# Patient Record
Sex: Male | Born: 1970 | Race: White | Hispanic: No | Marital: Married | Smoking: Former smoker
Health system: Southern US, Community
[De-identification: ages and names within clinical notes are randomized; demographics above are authoritative.]

## PROBLEM LIST (undated history)

## (undated) DIAGNOSIS — F32A Depression, unspecified: Secondary | ICD-10-CM

## (undated) DIAGNOSIS — K254 Chronic or unspecified gastric ulcer with hemorrhage: Secondary | ICD-10-CM

## (undated) DIAGNOSIS — F419 Anxiety disorder, unspecified: Secondary | ICD-10-CM

## (undated) DIAGNOSIS — I10 Essential (primary) hypertension: Secondary | ICD-10-CM

## (undated) DIAGNOSIS — Z87442 Personal history of urinary calculi: Secondary | ICD-10-CM

## (undated) HISTORY — PX: ESOPHAGOGASTRODUODENOSCOPY: SHX1529

---

## 2007-01-28 ENCOUNTER — Emergency Department: Payer: Self-pay | Admitting: Emergency Medicine

## 2007-01-29 ENCOUNTER — Emergency Department: Payer: Self-pay | Admitting: Emergency Medicine

## 2007-04-04 ENCOUNTER — Other Ambulatory Visit: Payer: Self-pay

## 2007-04-04 ENCOUNTER — Emergency Department: Payer: Self-pay | Admitting: Emergency Medicine

## 2009-03-12 ENCOUNTER — Emergency Department: Payer: Self-pay | Admitting: Internal Medicine

## 2009-03-16 ENCOUNTER — Ambulatory Visit: Payer: Self-pay | Admitting: Gastroenterology

## 2011-01-21 ENCOUNTER — Emergency Department: Payer: Self-pay | Admitting: Emergency Medicine

## 2011-02-07 ENCOUNTER — Observation Stay: Payer: Self-pay | Admitting: Internal Medicine

## 2011-06-19 ENCOUNTER — Emergency Department (HOSPITAL_COMMUNITY)
Admission: EM | Admit: 2011-06-19 | Discharge: 2011-06-19 | Disposition: A | Discharge status: Home / Self Care | Payer: Self-pay | Attending: Emergency Medicine | Admitting: Emergency Medicine

## 2011-06-19 ENCOUNTER — Encounter (HOSPITAL_COMMUNITY): Payer: Self-pay | Admitting: *Deleted

## 2011-06-19 DIAGNOSIS — M549 Dorsalgia, unspecified: Secondary | ICD-10-CM | POA: Insufficient documentation

## 2011-06-19 DIAGNOSIS — R5381 Other malaise: Secondary | ICD-10-CM | POA: Insufficient documentation

## 2011-06-19 DIAGNOSIS — R51 Headache: Secondary | ICD-10-CM | POA: Insufficient documentation

## 2011-06-19 DIAGNOSIS — I10 Essential (primary) hypertension: Secondary | ICD-10-CM | POA: Insufficient documentation

## 2011-06-19 DIAGNOSIS — M542 Cervicalgia: Secondary | ICD-10-CM | POA: Insufficient documentation

## 2011-06-19 HISTORY — DX: Essential (primary) hypertension: I10

## 2011-06-19 HISTORY — DX: Chronic or unspecified gastric ulcer with hemorrhage: K25.4

## 2011-06-19 MED ORDER — CYCLOBENZAPRINE HCL 10 MG PO TABS: 10.0000 mg | ORAL_TABLET | Freq: Two times a day (BID) | ORAL | Status: AC | PRN

## 2011-06-19 MED ORDER — HYDROCODONE-ACETAMINOPHEN 5-325 MG PO TABS: 1.0000 | ORAL_TABLET | ORAL | Status: AC | PRN

## 2011-06-19 NOTE — ED Notes (Signed)
Reports having lower back pain, neck pain, pain radiates into the back of his head. Denies any n/v/d. Reports having fever/chills.

## 2011-06-19 NOTE — ED Notes (Signed)
Pt walked back to bathroom without difficulty.

## 2011-06-19 NOTE — Discharge Instructions (Signed)
FOLLOW UP WITH DR. Ranell Patrick FOR FURTHER EVALUATION AND MANAGEMENT OF RECURRENT NECK AND BACK PAIN. TAKE MEDICATIONS AS PRESCRIBED. RETURN HERE WITH ANY WORSENING SYMPTOMS OR NEW CONCERNS.  Back Pain, Adult Low back pain is very common. About 1 in 5 people have back pain.The cause of low back pain is rarely dangerous. The pain often gets better over time.About half of people with a sudden onset of back pain feel better in just 2 weeks. About 8 in 10 people feel better by 6 weeks.  CAUSES Some common causes of back pain include:  Strain of the muscles or ligaments supporting the spine.   Wear and tear (degeneration) of the spinal discs.   Arthritis.   Direct injury to the back.  DIAGNOSIS Most of the time, the direct cause of low back pain is not known.However, back pain can be treated effectively even when the exact cause of the pain is unknown.Answering your caregiver's questions about your overall health and symptoms is one of the most accurate ways to make sure the cause of your pain is not dangerous. If your caregiver needs more information, he or she may order lab work or imaging tests (X-rays or MRIs).However, even if imaging tests show changes in your back, this usually does not require surgery. HOME CARE INSTRUCTIONS For many people, back pain returns.Since low back pain is rarely dangerous, it is often a condition that people can learn to [facility] their own.   Remain active. It is stressful on the back to sit or stand in one place. Do not sit, drive, or stand in one place for more than 30 minutes at a time. Take short walks on level surfaces as soon as pain allows.Try to increase the length of time you walk each day.   Do not stay in bed.Resting more than 1 or 2 days can delay your recovery.   Do not avoid exercise or work.Your body is made to move.It is not dangerous to be active, even though your back may hurt.Your back will likely heal faster if you return to being active  before your pain is gone.   Pay attention to your body when you bend and lift. Many people have less discomfortwhen lifting if they bend their knees, keep the load close to their bodies,and avoid twisting. Often, the most comfortable positions are those that put less stress on your recovering back.   Find a comfortable position to sleep. Use a firm mattress and lie on your side with your knees slightly bent. If you lie on your back, put a pillow under your knees.   Only take over-the-counter or prescription medicines as directed by your caregiver. Over-the-counter medicines to reduce pain and inflammation are often the most helpful.Your caregiver may prescribe muscle relaxant drugs.These medicines help dull your pain so you can more quickly return to your normal activities and healthy exercise.   Put ice on the injured area.   Put ice in a plastic bag.   Place a towel between your skin and the bag.   Leave the ice on for 15 to 20 minutes, 3 to 4 times a day for the first 2 to 3 days. After that, ice and heat may be alternated to reduce pain and spasms.   Ask your caregiver about trying back exercises and gentle massage. This may be of some benefit.   Avoid feeling anxious or stressed.Stress increases muscle tension and can worsen back pain.It is important to recognize when you are anxious or stressed and  learn ways to manage it.Exercise is a great option.  SEEK MEDICAL CARE IF:  You have pain that is not relieved with rest or medicine.   You have pain that does not improve in 1 week.   You have new symptoms.   You are generally not feeling well.  SEEK IMMEDIATE MEDICAL CARE IF:   You have pain that radiates from your back into your legs.   You develop new bowel or bladder control problems.   You have unusual weakness or numbness in your arms or legs.   You develop nausea or vomiting.   You develop abdominal pain.   You feel faint.  Document Released: 01/28/2005  Document Revised: 01/17/2011 Document Reviewed: 06/18/2010 [facility] Patient Information 2012 [place], [place].

## 2011-06-19 NOTE — ED Notes (Signed)
Pt complaining of lower back, neck and head pain.  Pt states that his neck bothers him all the time and has been "for quite a while."  Pt states that his head hurts on the left side with palpation but denies hitting his head on anything.  Pt also complains of lower back pain; states that when he moves, the pain "feels different" but does not elaborate on difference.  Pt has range of motion in neck.  Pt states that he twists and turns a lot with daily job but denies anything out of ordinary relating to injuring himself.  Pt not in acute distress.

## 2011-06-19 NOTE — ED Provider Notes (Signed)
History     CSN: [id number]  Arrival date & time 06/19/11  1151   First MD Initiated Contact with Patient 06/19/11 1222      Chief Complaint  Patient presents with  . Back Pain  . Headache    (Consider location/radiation/quality/duration/timing/severity/associated sxs/prior treatment) Patient is a 41 y.o. male presenting with back pain. The history is provided by the patient.  Back Pain  This is a recurrent problem. The current episode started 6 to 12 hours ago. The problem occurs constantly. The problem has not changed since onset.Associated with: The patient works as an Journalist, newspaper. Associated symptoms include headaches and weakness. Pertinent negatives include no fever and no numbness. Associated symptoms comments: Pain in the neck and upper left neck that is recurrent. He feels that it is worse with movement and better with some positions. No numbness into the extremities but he feels that over time his grip bilaterally has become weak. He also complains of low back pain as well that recurs concomitantly with the neck pain..    Past Medical History  Diagnosis Date  . Hypertension   . Bleeding gastric ulcer     History reviewed. No pertinent past surgical history.  History reviewed. No pertinent family history.  History  Substance Use Topics  . Smoking status: Current Everyday Smoker    Types: Cigarettes  . Smokeless tobacco: Not on file  . Alcohol Use: No      Review of Systems  Constitutional: Negative for fever and chills.  HENT: Positive for neck pain.   Musculoskeletal: Positive for back pain.       See HPI  Skin: Negative.   Neurological: Positive for weakness and headaches. Negative for numbness.       Headache relates to upper neck pain.    Allergies  Morphine and related and Aspirin  Home Medications   Current Outpatient Rx  Name Route Sig Dispense Refill  . ALPRAZOLAM 0.25 MG PO TABS Oral Take 0.25 mg by mouth 3 (three) times daily. For anxiety     . LISINOPRIL 20 MG PO TABS Oral Take 20 mg by mouth daily.    . TRAMADOL HCL 50 MG PO TABS Oral Take 50 mg by mouth every 6 (six) hours as needed. For pain      BP 137/90  Pulse 98  Temp(Src) 97.8 F (36.6 C) (Oral)  Resp 18  SpO2 98%  Physical Exam  Constitutional: He is oriented to person, place, and time. He appears well-developed and well-nourished.  HENT:  Head: Normocephalic and atraumatic.  Neck: Normal range of motion.  Cardiovascular:  No murmur heard. Pulmonary/Chest: Effort normal. He has no wheezes. He has no rales.  Musculoskeletal:       Minimal left paracervical tenderness without swelling. FROM neck and upper extremities without difficulty. Grip strength equal. Distal pulses are 2+  Neurological: He is alert and oriented to person, place, and time. He has normal strength. No sensory deficit. He displays a negative Romberg sign.    ED Course  Procedures (including critical care time)  Labs Reviewed - No data to display No results found.   No diagnosis found. 1. Neck pain, recurrent 2. Back pain, recurrent   MDM  Headache is related to neck pain, which is recurrent without neurologic deficit today on exam. Will refer to ortho for further management of recurrent episodes.        Rodena Medin, PA-C 06/19/11 1305

## 2011-06-19 NOTE — ED Notes (Signed)
Pt to bathroom; walks to bathroom without limp or other trouble noted.

## 2011-06-24 NOTE — ED Provider Notes (Signed)
Medical screening examination/treatment/procedure(s) were performed by non-physician practitioner and as supervising physician I was immediately available for consultation/collaboration.  Lucian Baswell, MD 06/24/11 1011 

## 2012-04-20 ENCOUNTER — Ambulatory Visit: Payer: Self-pay | Admitting: Physical Medicine and Rehabilitation

## 2013-04-29 ENCOUNTER — Emergency Department: Payer: Self-pay | Admitting: Emergency Medicine

## 2013-04-29 LAB — COMPREHENSIVE METABOLIC PANEL
ALT: 23 U/L (ref 12–78)
ANION GAP: 5 — AB (ref 7–16)
Albumin: 3.3 g/dL — ABNORMAL LOW (ref 3.4–5.0)
Alkaline Phosphatase: 100 U/L
BUN: 8 mg/dL (ref 7–18)
Bilirubin,Total: 0.7 mg/dL (ref 0.2–1.0)
Calcium, Total: 7.8 mg/dL — ABNORMAL LOW (ref 8.5–10.1)
Chloride: 108 mmol/L — ABNORMAL HIGH (ref 98–107)
Co2: 28 mmol/L (ref 21–32)
Creatinine: 1.15 mg/dL (ref 0.60–1.30)
EGFR (African American): >60
EGFR (Non-African Amer.): >60
Glucose: 97 mg/dL (ref 65–99)
Osmolality: 280 (ref 275–301)
POTASSIUM: 3.7 mmol/L (ref 3.5–5.1)
SGOT(AST): 20 U/L (ref 15–37)
Sodium: 141 mmol/L (ref 136–145)
TOTAL PROTEIN: 5.8 g/dL — AB (ref 6.4–8.2)

## 2013-04-29 LAB — CBC
HCT: 41.4 % (ref 40.0–52.0)
HGB: 14.2 g/dL (ref 13.0–18.0)
MCH: 31.9 pg (ref 26.0–34.0)
MCHC: 34.4 g/dL (ref 32.0–36.0)
MCV: 93 fL (ref 80–100)
PLATELETS: 169 10*3/uL (ref 150–440)
RBC: 4.46 10*6/uL (ref 4.40–5.90)
RDW: 13.5 % (ref 11.5–14.5)
WBC: 8.9 10*3/uL (ref 3.8–10.6)

## 2013-04-29 LAB — PRO B NATRIURETIC PEPTIDE: B-TYPE NATIURETIC PEPTID: 82 pg/mL (ref 0–125)

## 2013-04-29 LAB — TROPONIN I

## 2013-08-12 ENCOUNTER — Emergency Department: Payer: Self-pay

## 2014-06-05 NOTE — Discharge Summary (Signed)
PATIENT NAME:  Daniel Harris, Daniel Harris MR#:  [id number] DATE OF BIRTH:  [birth year]  DATE OF ADMISSION:  02/07/2011 DATE OF DISCHARGE:  02/08/2011  PRIMARY CARE PHYSICIAN: Dr. Antony Haste in [place]:  1. Chest pain, possible anxiety versus gastroesophageal reflux disease.  2. Hypertension.  3. Tobacco abuse.   MEDICATIONS ON DISCHARGE:  1. Lisinopril 40 mg daily.  2. Tramadol 50 mg every 4 hours as needed for pain.  3. Prilosec 20 mg p.o. daily for acid reflux.  4. Nicotine patch 21 mg to chest wall daily.   DIET: Low sodium diet.   ACTIVITY: Activity as tolerated. Follow-up in 1 to 2 weeks with Dr. Antony Haste.   REASON FOR ADMISSION: The patient was admitted 02/07/2011 and discharged 02/08/2011, came in with chest pain.   HISTORY OF PRESENT ILLNESS: The patient is a 44 year old man with hypertension and strong family history of coronary artery disease, active tobacco abuse, came in with chest pain coming and lasting minutes at a time, some left arm pain. He was admitted as an observation. A stress test was ordered.   HOSPITAL LABORATORY, DIAGNOSTIC AND RADIOLOGICAL DATA:  EKG shows normal sinus rhythm, 82 beats per minute.  Troponin negative.  White blood cell count 9.6, hemoglobin and hematocrit 15.3 and 44.6, platelet count of 244. Glucose 93, BUN 14, creatinine 1.02, sodium 141, potassium 4.1, chloride 105, CO2 27, calcium 8.9.  Liver function tests are normal.  Chest x-ray showed minimal perihilar subsegmental atelectasis on the left, cannot exclude bronchitis.  LDL 105, HDL 26, triglycerides 224. A1c 5.5.  Echocardiogram showed ejection fraction of 60%, mild mitral regurgitation.  Stress tests: Normal Lexiscan infusion without evidence of ischemia or arrhythmia, ejection fraction 71%, normal myocardial perfusion without evidence of myocardial ischemia.   HOSPITAL COURSE: The patient was chest pain free on the 28th, sent home in stable condition. Could be  gastroesophageal reflux disease versus anxiety. He was kept on his lisinopril for hypertension, blood pressure 115/75. Smoking cessation counseling was done, three minutes by me. A nicotine patch was prescribed upon discharge. The patient's chest pain is noncardiac.   CONDITION ON DISCHARGE: He was discharged home in stable condition.   TIME SPENT ON DISCHARGE: 35 minutes.   ____________________________ Tana Conch. Leslye Peer, MD rjw:cbb D: 02/08/2011 17:22:56 ET T: 02/10/2011 16:22:39 ET JOB#: [id number]  cc: Tana Conch. Leslye Peer, MD, <Dictator> Dr. Antony Haste in [place] MD ELECTRONICALLY SIGNED 02/27/2011 16:02

## 2014-11-28 ENCOUNTER — Emergency Department
Admission: EM | Admit: 2014-11-28 | Discharge: 2014-11-28 | Disposition: A | Discharge status: Home / Self Care | Payer: Worker's Compensation | Attending: Emergency Medicine | Admitting: Emergency Medicine

## 2014-11-28 ENCOUNTER — Emergency Department: Payer: Worker's Compensation

## 2014-11-28 ENCOUNTER — Encounter: Payer: Self-pay | Admitting: Emergency Medicine

## 2014-11-28 DIAGNOSIS — W228XXA Striking against or struck by other objects, initial encounter: Secondary | ICD-10-CM | POA: Insufficient documentation

## 2014-11-28 DIAGNOSIS — I10 Essential (primary) hypertension: Secondary | ICD-10-CM | POA: Insufficient documentation

## 2014-11-28 DIAGNOSIS — S5002XA Contusion of left elbow, initial encounter: Secondary | ICD-10-CM | POA: Diagnosis not present

## 2014-11-28 DIAGNOSIS — Z880 Allergy status to penicillin: Secondary | ICD-10-CM | POA: Diagnosis not present

## 2014-11-28 DIAGNOSIS — Z79899 Other long term (current) drug therapy: Secondary | ICD-10-CM | POA: Insufficient documentation

## 2014-11-28 DIAGNOSIS — Y9289 Other specified places as the place of occurrence of the external cause: Secondary | ICD-10-CM | POA: Insufficient documentation

## 2014-11-28 DIAGNOSIS — Z720 Tobacco use: Secondary | ICD-10-CM | POA: Diagnosis not present

## 2014-11-28 DIAGNOSIS — Y990 Civilian activity done for income or pay: Secondary | ICD-10-CM | POA: Insufficient documentation

## 2014-11-28 DIAGNOSIS — S59902A Unspecified injury of left elbow, initial encounter: Secondary | ICD-10-CM | POA: Diagnosis present

## 2014-11-28 DIAGNOSIS — Y9389 Activity, other specified: Secondary | ICD-10-CM | POA: Diagnosis not present

## 2014-11-28 MED ORDER — PREDNISONE 10 MG PO TABS: 10.0000 mg | ORAL_TABLET | ORAL | Status: DC

## 2014-11-28 MED ORDER — TRAMADOL HCL 50 MG PO TABS: 50.0000 mg | ORAL_TABLET | Freq: Four times a day (QID) | ORAL | Status: AC | PRN

## 2014-11-28 NOTE — Discharge Instructions (Signed)
°  Elbow Contusion  An elbow contusion is a deep bruise of the elbow. Contusions happen when an injury causes bleeding under the skin. Signs of bruising include pain, puffiness (swelling), and discolored skin. The contusion may turn blue, purple, or yellow. HOME CARE  Put ice on the injured area.  Put ice in a plastic bag.  Place a towel between your skin and the bag.  Leave the ice on for 15-20 minutes, 03-04 times a day.  Only take medicines as told by your doctor.  Rest your elbow until the pain and puffiness are better.  Raise (elevate) your elbow to lessen puffiness.  Put on an elastic wrap as told by your doctor. You can take it off for sleeping, showers, and baths. If your fingers get cold, blue, or lose feeling (numb), take the wrap off. Put the wrap back on more loosely.  Use your elbow only as told by your doctor. If you are asked to do elbow exercises, do them as told.  Keep all doctor visits as told. GET HELP RIGHT AWAY IF:  You have more redness, puffiness, or pain in your elbow.  Your puffiness or pain is not helped by medicines.  You have puffiness of the hand and fingers.  You are not able to move your fingers or wrist.  You start to lose feeling in your hand or fingers.  Your fingers or hand become cold or blue. MAKE SURE YOU:   Understand these instructions.  Will watch your condition.  Will get help right away if you are not doing well or get worse.   This information is not intended to replace advice given to you by your health care provider. Make sure you discuss any questions you have with your health care provider.   Document Released: 01/17/2011 Document Revised: 07/30/2011 Document Reviewed: 09/12/2014 Elsevier Interactive Patient Education [employer].

## 2014-11-28 NOTE — ED Provider Notes (Signed)
[facility] Emergency Department Provider Note  ____________________________________________  Time seen: Approximately 8:26 PM  I have reviewed the triage vital signs and the nursing notes.   HISTORY  Chief Complaint Elbow Pain    HPI Daniel Harris is a 44 y.o. male this emergency department complaining of left elbow pain status post hitting it on a piece of machinery at work. 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 Eyes any numbness or tingling. He denies any laceration or abrasion. He states that he has full range of motion. He denies any injury to the shoulder or wrist. He states the pain is mild at rest but moderate with movement or outpatient. Pain is sharp.   Past Medical History  Diagnosis Date  . Hypertension   . Bleeding gastric ulcer     There are no active problems to display for this patient.   History reviewed. No pertinent past surgical history.  Current Outpatient Rx  Name  Route  Sig  Dispense  Refill  . ALPRAZolam (XANAX) 0.25 MG tablet   Oral   Take 0.25 mg by mouth 3 (three) times daily. For anxiety         . lisinopril (PRINIVIL,ZESTRIL) 20 MG tablet   Oral   Take 20 mg by mouth daily.         . predniSONE (DELTASONE) 10 MG tablet   Oral   Take 1 tablet (10 mg total) by mouth as directed.   21 tablet   0   . traMADol (ULTRAM) 50 MG tablet   Oral   Take 1 tablet (50 mg total) by mouth every 6 (six) hours as needed.   20 tablet   0     Allergies Morphine and related; Aspirin; and Penicillins  History reviewed. No pertinent family history.  Social History Social History  Substance Use Topics  . Smoking status: Current Every Day Smoker    Types: Cigarettes  . Smokeless tobacco: None  . Alcohol Use: No    Review of Systems Constitutional: No fever/chills Eyes: No visual  changes. ENT: No sore throat. Cardiovascular: Denies chest pain. Respiratory: Denies shortness of breath. Gastrointestinal: No abdominal pain.  No nausea, no vomiting.  No diarrhea.  No constipation. Genitourinary: Negative for dysuria. Musculoskeletal: Negative for back pain. Endorses left elbow swelling and pain. Skin: Negative for rash. Neurological: Negative for headaches, focal weakness or numbness.  10-point ROS otherwise negative.  ____________________________________________   PHYSICAL EXAM:  VITAL SIGNS: ED Triage Vitals  Enc Vitals Group     BP 11/28/14 1951 162/103 mmHg     Pulse Rate 11/28/14 1951 103     Resp 11/28/14 1951 18     Temp 11/28/14 1951 98.1 F (36.7 C)     Temp Source 11/28/14 1951 Oral     SpO2 11/28/14 1951 96 %     Weight 11/28/14 1951 175 lb (79.379 kg)     Height 11/28/14 1951 5\' 6"  (1.676 m)     Head Cir --      Peak Flow --      Pain Score --      Pain Loc --      Pain Edu? --      Excl. in [place]? --     Constitutional: Alert and oriented. Well appearing and in no acute distress. Eyes:  Conjunctivae are normal. PERRL. EOMI. Head: Atraumatic. Nose: No congestion/rhinnorhea. Mouth/Throat: Mucous membranes are moist.  Oropharynx non-erythematous. Neck: No stridor.   Cardiovascular: Normal rate, regular rhythm. Grossly normal heart sounds.  Good peripheral circulation. Respiratory: Normal respiratory effort.  No retractions. Lungs CTAB. Gastrointestinal: Soft and nontender. No distention. No abdominal bruits. No CVA tenderness. Musculoskeletal: No lower extremity tenderness nor edema.  No joint effusions. Edema noted to left posterior elbow. No abrasion, laceration, ecchymosis or contusion noted. Patient is tender to palpation over the olecranon process. No deformity noted to palpation. Nontender to palpation over the lateral and medial epicondyle. Range of motion of the elbow. Exams of left wrist and shoulder are unremarkable. Sensation and  pulses intact distally. Neurologic:  Normal speech and language. No gross focal neurologic deficits are appreciated. No gait instability. Skin:  Skin is warm, dry and intact. No rash noted. Psychiatric: Mood and affect are normal. Speech and behavior are normal.  ____________________________________________   LABS (all labs ordered are listed, but only abnormal results are displayed)  Labs Reviewed - No data to display ____________________________________________  EKG   ____________________________________________  RADIOLOGY  Left elbow x-ray Impression: No acute bony abnormality. Soft tissue swelling appreciated. ____________________________________________   PROCEDURES  Procedure(s) performed: None  Critical Care performed: No  ____________________________________________   INITIAL IMPRESSION / ASSESSMENT AND PLAN / ED COURSE  Pertinent labs & imaging results that were available during my care of the patient were reviewed by me and considered in my medical decision making (see chart for details).  Patient's history, symptoms, physical exam are consistent with contusion to the left elbow. I informed the patient of findings and diagnosis and he verbalizes understanding of same. I'll place the patient on a short course of prednisone since the patient is unable to take anti-inflammatories due to a previous history of gastric ulcers. Patient also given a prescription for tramadol for pain relief. Patient elevated BP placed in a sling for additional symptomatically. Patient verbalizes that he is able to return to work and would like to do so. Patient is informed that he should return to orthopedics should symptoms persist or worsen. Patient verbalizes understanding same. ____________________________________________   FINAL CLINICAL IMPRESSION(S) / ED DIAGNOSES  Final diagnoses:  Elbow contusion, left, initial encounter      Darletta Moll, PA-C 11/28/14  2049  Orbie Pyo, MD 11/28/14 2156

## 2016-03-18 ENCOUNTER — Encounter: Payer: Self-pay | Admitting: Emergency Medicine

## 2016-03-18 ENCOUNTER — Emergency Department: Payer: BC Managed Care – PPO

## 2016-03-18 ENCOUNTER — Emergency Department
Admission: EM | Admit: 2016-03-18 | Discharge: 2016-03-18 | Disposition: A | Discharge status: Home / Self Care | Payer: BC Managed Care – PPO | Attending: Emergency Medicine | Admitting: Emergency Medicine

## 2016-03-18 DIAGNOSIS — F419 Anxiety disorder, unspecified: Secondary | ICD-10-CM | POA: Insufficient documentation

## 2016-03-18 DIAGNOSIS — F4322 Adjustment disorder with anxiety: Secondary | ICD-10-CM | POA: Diagnosis not present

## 2016-03-18 DIAGNOSIS — I10 Essential (primary) hypertension: Secondary | ICD-10-CM | POA: Diagnosis not present

## 2016-03-18 DIAGNOSIS — Z87891 Personal history of nicotine dependence: Secondary | ICD-10-CM | POA: Diagnosis not present

## 2016-03-18 DIAGNOSIS — F430 Acute stress reaction: Secondary | ICD-10-CM

## 2016-03-18 DIAGNOSIS — R413 Other amnesia: Secondary | ICD-10-CM | POA: Diagnosis present

## 2016-03-18 DIAGNOSIS — F411 Generalized anxiety disorder: Secondary | ICD-10-CM

## 2016-03-18 LAB — CBC WITH DIFFERENTIAL/PLATELET
Basophils Absolute: 0.1 10*3/uL (ref 0–0.1)
Basophils Relative: 1 %
Eosinophils Absolute: 0 10*3/uL (ref 0–0.7)
Eosinophils Relative: 0 %
HCT: 47.5 % (ref 40.0–52.0)
HEMOGLOBIN: 16.4 g/dL (ref 13.0–18.0)
LYMPHS ABS: 1.9 10*3/uL (ref 1.0–3.6)
Lymphocytes Relative: 17 %
MCH: 31.6 pg (ref 26.0–34.0)
MCHC: 34.5 g/dL (ref 32.0–36.0)
MCV: 91.5 fL (ref 80.0–100.0)
MONOS PCT: 6 %
Monocytes Absolute: 0.7 10*3/uL (ref 0.2–1.0)
NEUTROS ABS: 8.3 10*3/uL — AB (ref 1.4–6.5)
NEUTROS PCT: 76 %
Platelets: 233 10*3/uL (ref 150–440)
RBC: 5.19 MIL/uL (ref 4.40–5.90)
RDW: 12.9 % (ref 11.5–14.5)
WBC: 11 10*3/uL — AB (ref 3.8–10.6)

## 2016-03-18 LAB — COMPREHENSIVE METABOLIC PANEL
ALK PHOS: 101 U/L (ref 38–126)
ALT: 24 U/L (ref 17–63)
ANION GAP: 7 (ref 5–15)
AST: 23 U/L (ref 15–41)
Albumin: 4.5 g/dL (ref 3.5–5.0)
BUN: 6 mg/dL (ref 6–20)
CALCIUM: 8.8 mg/dL — AB (ref 8.9–10.3)
CO2: 25 mmol/L (ref 22–32)
CREATININE: 0.94 mg/dL (ref 0.61–1.24)
Chloride: 105 mmol/L (ref 101–111)
GFR calc non Af Amer: >60 mL/min (ref >60)
Glucose, Bld: 114 mg/dL — ABNORMAL HIGH (ref 65–99)
Potassium: 3.8 mmol/L (ref 3.5–5.1)
Sodium: 137 mmol/L (ref 135–145)
TOTAL PROTEIN: 7.2 g/dL (ref 6.5–8.1)
Total Bilirubin: 1.1 mg/dL (ref 0.3–1.2)

## 2016-03-18 LAB — URINALYSIS, COMPLETE (UACMP) WITH MICROSCOPIC
BACTERIA UA: NONE SEEN
Bilirubin Urine: NEGATIVE
Glucose, UA: NEGATIVE mg/dL
HGB URINE DIPSTICK: NEGATIVE
KETONES UR: NEGATIVE mg/dL
Leukocytes, UA: NEGATIVE
NITRITE: NEGATIVE
PH: 6 (ref 5.0–8.0)
PROTEIN: NEGATIVE mg/dL
SPECIFIC GRAVITY, URINE: 1.004 — AB (ref 1.005–1.030)

## 2016-03-18 LAB — TROPONIN I: Troponin I: <0.03 ng/mL (ref <0.03)

## 2016-03-18 MED ORDER — CITALOPRAM HYDROBROMIDE 20 MG PO TABS: 20.0000 mg | ORAL_TABLET | Freq: Every day | ORAL | 1 refills | Status: DC

## 2016-03-18 MED ORDER — LORAZEPAM 1 MG PO TABS: 1.0000 mg | ORAL_TABLET | Freq: Two times a day (BID) | ORAL | 0 refills | Status: DC

## 2016-03-18 MED ORDER — TRAZODONE HCL 100 MG PO TABS: 100.0000 mg | ORAL_TABLET | Freq: Every evening | ORAL | 1 refills | Status: DC | PRN

## 2016-03-18 MED ORDER — DIAZEPAM 5 MG PO TABS
10.0000 mg | ORAL_TABLET | Freq: Once | ORAL | Status: AC
  Administered 2016-03-18: 10 mg via ORAL
  Filled 2016-03-18: qty 2

## 2016-03-18 MED ORDER — ALPRAZOLAM 0.25 MG PO TABS: 0.2500 mg | ORAL_TABLET | Freq: Two times a day (BID) | ORAL | 0 refills | Status: DC | PRN

## 2016-03-18 NOTE — ED Triage Notes (Signed)
Wife states patient forgetful x 2 days, smile symmetrical, grips and leg strength equal. Decreased appetite. Wife states patient has been under a lot of stress. Patient speaks sparingly but speech clear. Tearful at triage.

## 2016-03-18 NOTE — Consult Note (Signed)
[facility] Face-to-Face Psychiatry Consult   Reason for Consult:  Consult for 46 year old man who came to the emergency room complaining of stress and anxiety symptoms Referring Physician:  Archie Balboa Patient Identification: NOX TALENT MRN:  [id number] Principal Diagnosis: Adjustment disorder with anxiety Diagnosis:   Patient Active Problem List   Diagnosis Date Noted  . Adjustment disorder with anxiety [F43.22] 03/18/2016  . Generalized anxiety disorder [F41.1] 03/18/2016    Total Time spent with patient: 1 hour  Subjective:   Daniel Harris is a 46 y.o. male patient admitted with "I feel like I'm having a nervous breakdown".  HPI:  Patient interviewed. Chart reviewed. Labs reviewed. 46 year old man came to the emergency room today complaining of about 3 days of feeling overwhelmed and anxious. He says that on Friday his son was arrested for a domestic charge related to some kind of altercation with his girlfriend. The son spent 48 hours in jail and the father became overwhelmed with anxiety. He says that he hasn't been able to eat since Friday. He hasn't slept more than a couple hours. He has not been able to concentrate or do anything all weekend. He felt like everything in his environment was exaggerated so that every noise around him was making him jump and feel frightened. He denies any hallucinations. He denies having any suicidal intent or plan. He admits that he made a statement about how if he were dead he would not have to deal with this but absolutely denies having any thought of wanting to harm himself. Denies homicidal ideation. Patient says he is not drinking and has not been using any drugs at all. Wife provided history that the patient's level of anxiety has actually been very elevated ever since October. The son apparently had a first arrest for a separate incident back then and ever since then the patient has been on edge. This last incident on Friday just made it  worse.  Social history: Patient works for [employer]. Lives with his wife child.  Medical history: High blood pressure. No other known medical problems.  Substance abuse history: Says that he drinks only occasionally during the summer denies any other drug abuse is nothing in the chart to suggest drug or alcohol abuse. No controlled substances have been prescribed recently.  Past Psychiatric History: Never seen a psychiatrist or therapist. A few years ago he went through a spell of anxiety related to work and his primary care doctor gave him alprazolam. He says he was taking that a couple times a day for a few months and then stopped it on his own. Never been on any other medicine for mental health issues. No history of suicidal or violent behavior.  Risk to Self: Suicidal Ideation: No Suicidal Intent: No Is patient at risk for suicide?: No Suicidal Plan?: No Access to Means: No What has been your use of drugs/alcohol within the last 12 months?: none How many times?: 0 Other Self Harm Risks: none Triggers for Past Attempts: None known Intentional Self Injurious Behavior: None Risk to Others: Homicidal Ideation: No Thoughts of Harm to Others: No Current Homicidal Intent: No Current Homicidal Plan: No Access to Homicidal Means: No Identified Victim: none History of harm to others?: No Assessment of Violence: None Noted Violent Behavior Description: none Does patient have access to weapons?: No Criminal Charges Pending?: No Does patient have a court date: No Prior Inpatient Therapy: Prior Inpatient Therapy: No Prior Outpatient Therapy: Prior Outpatient Therapy: No Does  patient have an ACCT team?: No Does patient have Intensive In-House Services?  : No Does patient have [facility] services? : No Does patient have P4CC services?: No  Past Medical History:  Past Medical History:  Diagnosis Date  . Bleeding gastric ulcer   . Hypertension     History reviewed. No pertinent surgical history. Family History: No family history on file. Family Psychiatric  History: No known family history Social History:  History  Alcohol Use No     History  Drug Use No    Social History   Social History  . Marital status: Married    Spouse name: N/A  . Number of children: N/A  . Years of education: N/A   Social History Main Topics  . Smoking status: Former Smoker    Types: Cigarettes  . Smokeless tobacco: None  . Alcohol use No  . Drug use: No  . Sexual activity: Not Asked   Other Topics Concern  . None   Social History Narrative  . None   Additional Social History:    Allergies:   Allergies  Allergen Reactions  . Morphine And Related Nausea And Vomiting  . Aspirin Other (See Comments)    Causes GI bleeding  . Penicillins Hives and Swelling    Labs:  Results for orders placed or performed during the hospital encounter of 03/18/16 (from the past 48 hour(s))  CBC with Differential     Status: Abnormal   Collection Time: 03/18/16 12:02 PM  Result Value Ref Range   WBC 11.0 (H) 3.8 - 10.6 K/uL   RBC 5.19 4.40 - 5.90 MIL/uL   Hemoglobin 16.4 13.0 - 18.0 g/dL   HCT 47.5 40.0 - 52.0 %   MCV 91.5 80.0 - 100.0 fL   MCH 31.6 26.0 - 34.0 pg   MCHC 34.5 32.0 - 36.0 g/dL   RDW 12.9 11.5 - 14.5 %   Platelets 233 150 - 440 K/uL   Neutrophils Relative % 76 %   Neutro Abs 8.3 (H) 1.4 - 6.5 K/uL   Lymphocytes Relative 17 %   Lymphs Abs 1.9 1.0 - 3.6 K/uL   Monocytes Relative 6 %   Monocytes Absolute 0.7 0.2 - 1.0 K/uL   Eosinophils Relative 0 %   Eosinophils Absolute 0.0 0 - 0.7 K/uL   Basophils Relative 1 %   Basophils Absolute 0.1 0 - 0.1 K/uL  Comprehensive metabolic panel     Status: Abnormal   Collection Time: 03/18/16 12:02 PM  Result Value Ref Range   Sodium 137 135 - 145 mmol/L   Potassium 3.8 3.5 - 5.1 mmol/L   Chloride 105 101 - 111 mmol/L   CO2 25 22 - 32 mmol/L   Glucose, Bld 114 (H) 65 - 99 mg/dL   BUN 6  6 - 20 mg/dL   Creatinine, Ser 0.94 0.61 - 1.24 mg/dL   Calcium 8.8 (L) 8.9 - 10.3 mg/dL   Total Protein 7.2 6.5 - 8.1 g/dL   Albumin 4.5 3.5 - 5.0 g/dL   AST 23 15 - 41 U/L   ALT 24 17 - 63 U/L   Alkaline Phosphatase 101 38 - 126 U/L   Total Bilirubin 1.1 0.3 - 1.2 mg/dL   GFR calc non Af Amer >60 >60 mL/min   GFR calc Af Amer >60 >60 mL/min    Comment: (NOTE) The eGFR has been calculated using the CKD EPI equation. This calculation has not been validated in all clinical situations. eGFR's persistently <60  mL/min signify possible Chronic Kidney Disease.    Anion gap 7 5 - 15  Troponin I     Status: None   Collection Time: 03/18/16 12:02 PM  Result Value Ref Range   Troponin I <0.03 <0.03 ng/mL  Urinalysis, Complete w Microscopic     Status: Abnormal   Collection Time: 03/18/16 12:08 PM  Result Value Ref Range   Color, Urine YELLOW (A) YELLOW   APPearance CLEAR (A) CLEAR   Specific Gravity, Urine 1.004 (L) 1.005 - 1.030   pH 6.0 5.0 - 8.0   Glucose, UA NEGATIVE NEGATIVE mg/dL   Hgb urine dipstick NEGATIVE NEGATIVE   Bilirubin Urine NEGATIVE NEGATIVE   Ketones, ur NEGATIVE NEGATIVE mg/dL   Protein, ur NEGATIVE NEGATIVE mg/dL   Nitrite NEGATIVE NEGATIVE   Leukocytes, UA NEGATIVE NEGATIVE   RBC / HPF 0-5 0 - 5 RBC/hpf   WBC, UA 0-5 0 - 5 WBC/hpf   Bacteria, UA NONE SEEN NONE SEEN   Squamous Epithelial / LPF 0-5 (A) NONE SEEN    No current facility-administered medications for this encounter.    Current Outpatient Prescriptions  Medication Sig Dispense Refill  . ALPRAZolam (XANAX) 0.25 MG tablet Take 0.25 mg by mouth 3 (three) times daily. For anxiety    . ALPRAZolam (XANAX) 0.25 MG tablet Take 1 tablet (0.25 mg total) by mouth 2 (two) times daily as needed for anxiety. 30 tablet 0  . citalopram (CELEXA) 20 MG tablet Take 1 tablet (20 mg total) by mouth daily. 30 tablet 1  . lisinopril (PRINIVIL,ZESTRIL) 20 MG tablet Take 20 mg by mouth daily.    . predniSONE (DELTASONE)  10 MG tablet Take 1 tablet (10 mg total) by mouth as directed. 21 tablet 0  . traZODone (DESYREL) 100 MG tablet Take 1 tablet (100 mg total) by mouth at bedtime as needed for sleep. 30 tablet 1    Musculoskeletal: Strength & Muscle Tone: within normal limits Gait & Station: normal Patient leans: N/A  Psychiatric Specialty Exam: Physical Exam  Nursing note and vitals reviewed. Constitutional: He appears well-developed and well-nourished.  HENT:  Head: Normocephalic and atraumatic.  Eyes: Conjunctivae are normal. Pupils are equal, round, and reactive to light.  Neck: Normal range of motion.  Cardiovascular: Regular rhythm and normal heart sounds.   Respiratory: Effort normal. No respiratory distress.  GI: Soft.  Musculoskeletal: Normal range of motion.  Neurological: He is alert.  Skin: Skin is warm and dry.  Psychiatric: His affect is blunt. His speech is delayed and tangential. He is slowed. Thought content is not paranoid. He expresses impulsivity. He expresses no homicidal and no suicidal ideation. He exhibits abnormal recent memory.    Review of Systems  Constitutional: Negative.   HENT: Negative.   Eyes: Negative.   Respiratory: Negative.   Cardiovascular: Negative.   Gastrointestinal: Negative.   Musculoskeletal: Negative.   Skin: Negative.   Neurological: Negative.   Psychiatric/Behavioral: Positive for memory loss. Negative for depression, hallucinations, substance abuse and suicidal ideas. The patient is nervous/anxious and has insomnia.     Blood pressure 125/88, pulse 71, temperature 98.2 F (36.8 C), temperature source Oral, resp. rate 15, height '5\' 6"'  (1.676 m), weight 75.8 kg (167 lb), SpO2 94 %.Body mass index is 26.95 kg/m.  General Appearance: Casual  Eye Contact:  Good  Speech:  Slow  Volume:  Decreased  Mood:  Anxious  Affect:  Blunt  Thought Process:  Goal Directed  Orientation:  Full (Time, Place, and Person)  Thought Content:  Logical  Suicidal  Thoughts:  No  Homicidal Thoughts:  No  Memory:  Immediate;   Good Recent;   Poor Remote;   Fair  Judgement:  Fair  Insight:  Fair  Psychomotor Activity:  Decreased  Concentration:  Concentration: Fair  Recall:  [employer] of Knowledge:  Fair  Language:  Fair  Akathisia:  No  Handed:  Right  AIMS (if indicated):     Assets:  Communication Skills Desire for Improvement Financial Resources/Insurance Housing Physical Health Resilience Social Support  ADL's:  Intact  Cognition:  WNL  Sleep:        Treatment Plan Summary: Medication management and Plan 46 year old man presents with anxiety symptoms and some level of depression but not suicidal. Family and patient admit that he is a chronically anxious person but that these incidents with his son have really pushed him over the edge. Nevertheless the patient is not psychotic nor is he suicidal or dangerous. He does not require inpatient hospitalization. Patient was educated about anxiety and depressive disorders and appropriate treatment. He will be given a prescription for Celexa 20 mg a day, Xanax 0.25 mg twice a day as needed with only 30 pills provided, trazodone 100 mg at night as needed for sleep. Patient was instructed about the risk of abuse and addiction with Xanax. Case reviewed with emergency room doctor. Patient and family strongly advised that he should find a local mental health provider through his insurance and start seeing somebody for therapy as well. They agree to the plan.  Disposition: No evidence of imminent risk to self or others at present.   Patient does not meet criteria for psychiatric inpatient admission. Supportive therapy provided about ongoing stressors. Discussed crisis plan, support from social network, calling 911, coming to the Emergency Department, and calling Suicide Hotline.  Alethia Berthold, MD 03/18/2016 4:46 PM

## 2016-03-18 NOTE — BH Assessment (Signed)
Tele Assessment Note   Daniel Harris is an 46 y.o. male who came to the ED with his wife and sister in law with altered mental status after he had to get his son out of jail on Friday evening. He states that this had been an extremely stressful event and he has been having difficulty with symptoms of panic and stress since Friday. He states that he has been forgetting things, unsteady gait, tremors, sweats, racing mind, increased heart rate and hyper sensitivity to sound and light. He states that he has a history of anxiety but does not currently take anything for this. He denies any substance abuse issues- no resulted UDS to review at this time. He states that he has taken antidepressants in the past for some mild depression symptoms but he hasn't taken it in "years". Wife states that he has been having trouble remembering things which is unlike him. She states that she believes these symptoms are related to the acute stress of his son being arrested. Pt denies SI, HI or AVH at this time. He states that he does not have an outpatient provider for therapy at this time. He has no history of SI or inpatient hospitalizations in the past. Pt has a good support system.   Dr. Weber Cooks to determine final disposition.   Diagnosis: Adjustment disorder with anxiety   Past Medical History:  Past Medical History:  Diagnosis Date  . Bleeding gastric ulcer   . Hypertension     History reviewed. No pertinent surgical history.  Family History: No family history on file.  Social History:  reports that he has quit smoking. His smoking use included Cigarettes. He does not have any smokeless tobacco history on file. He reports that he does not drink alcohol or use drugs.  Additional Social History:  Alcohol / Drug Use History of alcohol / drug use?: No history of alcohol / drug abuse  CIWA: CIWA-Ar BP: (!) 128/95 Pulse Rate: 71 COWS:    PATIENT STRENGTHS: (choose at least two) Average or above average  intelligence Supportive family/friends  Allergies:  Allergies  Allergen Reactions  . Morphine And Related Nausea And Vomiting  . Aspirin Other (See Comments)    Causes GI bleeding  . Penicillins Hives and Swelling    Home Medications:  (Not in a hospital admission)  OB/GYN Status:  No LMP for male patient.  General Assessment Data Location of Assessment: [facility] ED TTS Assessment: In system Is this a Tele or Face-to-Face Assessment?: Tele Assessment Is this an Initial Assessment or a Re-assessment for this encounter?: Initial Assessment Marital status: Married Living Arrangements: Spouse/significant other Can pt return to current living arrangement?: Yes Admission Status: Voluntary Is patient capable of signing voluntary admission?: Yes Referral Source: Self/Family/Friend Insurance type: [place] Living Arrangements: Spouse/significant other Name of Psychiatrist: None Name of Therapist: None  Education Status Is patient currently in school?: No Highest grade of school patient has completed: 12th  Risk to self with the past 6 months Suicidal Ideation: No Has patient been a risk to self within the past 6 months prior to admission? : No Suicidal Intent: No Has patient had any suicidal intent within the past 6 months prior to admission? : No Is patient at risk for suicide?: No Suicidal Plan?: No Has patient had any suicidal plan within the past 6 months prior to admission? : No Access to Means: No What has been your use of drugs/alcohol within the last  12 months?: none Previous Attempts/Gestures: No How many times?: 0 Other Self Harm Risks: none Triggers for Past Attempts: None known Intentional Self Injurious Behavior: None Family Suicide History: No Recent stressful life event(s): Other (Comment) (Son went to jail for a night ) Persecutory voices/beliefs?: No Depression: Yes Depression Symptoms: Despondent (overwhelmed) Substance abuse history  and/or treatment for substance abuse?: No Suicide prevention information given to non-admitted patients: Not applicable  Risk to Others within the past 6 months Homicidal Ideation: No Does patient have any lifetime risk of violence toward others beyond the six months prior to admission? : No Thoughts of Harm to Others: No Current Homicidal Intent: No Current Homicidal Plan: No Access to Homicidal Means: No Identified Victim: none History of harm to others?: No Assessment of Violence: None Noted Violent Behavior Description: none Does patient have access to weapons?: No Criminal Charges Pending?: No Does patient have a court date: No Is patient on probation?: No  Psychosis Hallucinations: None noted Delusions: None noted  Mental Status Report Appearance/Hygiene: Unremarkable Eye Contact: Good Motor Activity: Freedom of movement Speech: Logical/coherent Level of Consciousness: Alert Mood: Anxious Affect: Anxious Anxiety Level: Panic Attacks Panic attack frequency: since friday Most recent panic attack: today Thought Processes: Coherent Judgement: Unimpaired Orientation: Person, Place, Time, Situation Obsessive Compulsive Thoughts/Behaviors: Moderate  Cognitive Functioning Concentration: Decreased Memory: Recent Impaired, Remote Intact IQ: Average Insight: Fair Impulse Control: Fair Appetite: Poor (hasn't eaten since Friday) Weight Loss: 0 Weight Gain: 0 Sleep: Decreased Total Hours of Sleep: 6 Vegetative Symptoms: None  ADLScreening [facility] Assessment Services) Patient's cognitive ability adequate to safely complete daily activities?: Yes Patient able to express need for assistance with ADLs?: Yes Independently performs ADLs?: Yes (appropriate for developmental age)  Prior Inpatient Therapy Prior Inpatient Therapy: No  Prior Outpatient Therapy Prior Outpatient Therapy: No Does patient have an ACCT team?: No Does patient have Intensive In-House Services?  :  No Does patient have [facility] services? : No Does patient have P4CC services?: No  ADL Screening (condition at time of admission) Patient's cognitive ability adequate to safely complete daily activities?: Yes Is the patient deaf or have difficulty hearing?: No Does the patient have difficulty seeing, even when wearing glasses/contacts?: No Does the patient have difficulty concentrating, remembering, or making decisions?: No Patient able to express need for assistance with ADLs?: Yes Does the patient have difficulty dressing or bathing?: No Independently performs ADLs?: Yes (appropriate for developmental age) Does the patient have difficulty walking or climbing stairs?: No Weakness of Legs: None Weakness of Arms/Hands: None  Home Assistive Devices/Equipment Home Assistive Devices/Equipment: None  Therapy Consults (therapy consults require a physician order) PT Evaluation Needed: No OT Evalulation Needed: No SLP Evaluation Needed: No Abuse/Neglect Assessment (Assessment to be complete while patient is alone) Physical Abuse: Denies Verbal Abuse: Denies Sexual Abuse: Denies Exploitation of patient/patient's resources: Denies Self-Neglect: Denies Values / Beliefs Cultural Requests During Hospitalization: None Spiritual Requests During Hospitalization: None Consults Spiritual Care Consult Needed: No Social Work Consult Needed: No Regulatory affairs officer (For Healthcare) Does Patient Have a Medical Advance Directive?: No Would patient like information on creating a medical advance directive?: No - Patient declined Nutrition Screen- [facility] Adult/[facility]/AP Patient's home diet: Regular Has the patient recently lost weight without trying?: No Has the patient been eating poorly because of a decreased appetite?: No Malnutrition Screening Tool Score: 0  Additional Information 1:1 In Past 12 Months?: No CIRT Risk: No Elopement Risk: No Does patient have medical clearance?: Yes     Disposition:  Disposition Initial Assessment Completed for this Encounter: Yes Disposition of Patient: Outpatient treatment  Kalina Morabito 03/18/2016 3:37 PM

## 2016-03-18 NOTE — ED Provider Notes (Signed)
[facility] Emergency Department Provider Note        Time seen: ----------------------------------------- 12:15 PM on 03/18/2016 -----------------------------------------    I have reviewed the triage vital signs and the nursing notes.   HISTORY  Chief Complaint Memory Loss    HPI Daniel Harris is a 46 y.o. male who presents to ER for memory trouble for the last 48 hours. Wife states his been under a lot of stress. He denies any numbness, tingling or weakness. Patient reports his son was recently incarcerated and this has caused him significant stress. He is not sleeping well at night, he denies recent illness or other complaints.   Past Medical History:  Diagnosis Date  . Bleeding gastric ulcer   . Hypertension     There are no active problems to display for this patient.   History reviewed. No pertinent surgical history.  Allergies Morphine and related; Aspirin; and Penicillins  Social History Social History  Substance Use Topics  . Smoking status: Former Smoker    Types: Cigarettes  . Smokeless tobacco: Not on file  . Alcohol use No    Review of Systems Constitutional: Negative for fever. Cardiovascular: Negative for chest pain. Respiratory: Negative for shortness of breath. Gastrointestinal: Negative for abdominal pain, vomiting and diarrhea. Skin: Negative for rash. Neurological: Negative for headaches, focal weakness or numbness. Psychiatric: Positive for anxiety  10-point ROS otherwise negative.  ____________________________________________   PHYSICAL EXAM:  VITAL SIGNS: ED Triage Vitals  Enc Vitals Group     BP 03/18/16 1141 (!) 139/102     Pulse Rate 03/18/16 1141 95     Resp 03/18/16 1141 20     Temp 03/18/16 1141 98.2 F (36.8 C)     Temp Source 03/18/16 1141 Oral     SpO2 03/18/16 1141 96 %     Weight 03/18/16 1142 167 lb (75.8 kg)     Height 03/18/16 1142 5\' 6"  (1.676 m)     Head Circumference --     Peak Flow --      Pain Score 03/18/16 1201 3     Pain Loc --      Pain Edu? --      Excl. in [place]? --    Constitutional: Alert and oriented. Well appearing and in no distress. Eyes: Conjunctivae are normal. Normal extraocular movements. ENT   Head: Normocephalic and atraumatic.   Nose: No congestion/rhinnorhea.   Mouth/Throat: Mucous membranes are moist.   Neck: No stridor. Cardiovascular: Normal rate, regular rhythm. No murmurs, rubs, or gallops. Respiratory: Normal respiratory effort without tachypnea nor retractions. Breath sounds are clear and equal bilaterally. No wheezes/rales/rhonchi. Gastrointestinal: Soft and nontender. Normal bowel sounds Musculoskeletal: Nontender with normal range of motion in all extremities. No lower extremity tenderness nor edema. Neurologic:  Normal speech and language. No gross focal neurologic deficits are appreciated.  Skin:  Skin is warm, dry and intact. No rash noted. Psychiatric: Depressed mood and affect, patient tearful ____________________________________________  ED COURSE:  Pertinent labs & imaging results that were available during my care of the patient were reviewed by me and considered in my medical decision making (see chart for details). Patient presents to ER likely for symptoms secondary to stress. We will assess with labs and imaging. He'll receive oral Valium.   Procedures ____________________________________________   LABS (pertinent positives/negatives)  Labs Reviewed  CBC WITH DIFFERENTIAL/PLATELET - Abnormal; Notable for the following:       Result Value   WBC 11.0 (*)  Neutro Abs 8.3 (*)    All other components within normal limits  COMPREHENSIVE METABOLIC PANEL - Abnormal; Notable for the following:    Glucose, Bld 114 (*)    Calcium 8.8 (*)    All other components within normal limits  URINALYSIS, COMPLETE (UACMP) WITH MICROSCOPIC - Abnormal; Notable for the following:    Color, Urine YELLOW (*)     APPearance CLEAR (*)    Specific Gravity, Urine 1.004 (*)    Squamous Epithelial / LPF 0-5 (*)    All other components within normal limits  TROPONIN I  CBG MONITORING, ED    RADIOLOGY  CT head  IMPRESSION: Normal head CT. No cause of headache identified. ____________________________________________  FINAL ASSESSMENT AND PLAN  Anxiety  Plan: Patient with labs and imaging as dictated above. Patient's symptoms are combination of anxiety due to recent stressors and poor sleep. He is feeling better after oral Valium and I will prescribe a short supply of Ativan for his symptoms. There is no acute emergency medical condition identified but he does have significant stress from his son being incarcerated. He is stable for outpatient follow-up.   Earleen Newport, MD   Note: This note was generated in part or whole with voice recognition software. Voice recognition is usually quite accurate but there are transcription errors that can and very often do occur. I apologize for any typographical errors that were not detected and corrected.     Earleen Newport, MD 03/18/16 [phone]

## 2016-03-18 NOTE — BHH Counselor (Signed)
Per Dr. Weber Cooks pt does not meet inpatient criteria and will be sent home with medication for anxiety and the plan to follow up with outpatient therapist and psychiatrist.   [facility], LCASA

## 2016-05-23 ENCOUNTER — Other Ambulatory Visit: Payer: Self-pay | Admitting: Psychiatry

## 2018-10-18 ENCOUNTER — Emergency Department: Payer: BC Managed Care – PPO

## 2018-10-18 ENCOUNTER — Encounter: Payer: Self-pay | Admitting: Emergency Medicine

## 2018-10-18 ENCOUNTER — Other Ambulatory Visit: Payer: Self-pay

## 2018-10-18 ENCOUNTER — Emergency Department
Admission: EM | Admit: 2018-10-18 | Discharge: 2018-10-18 | Disposition: A | Discharge status: Home / Self Care | Payer: BC Managed Care – PPO | Attending: Emergency Medicine | Admitting: Emergency Medicine

## 2018-10-18 DIAGNOSIS — Y999 Unspecified external cause status: Secondary | ICD-10-CM | POA: Insufficient documentation

## 2018-10-18 DIAGNOSIS — Y9389 Activity, other specified: Secondary | ICD-10-CM | POA: Insufficient documentation

## 2018-10-18 DIAGNOSIS — Z87891 Personal history of nicotine dependence: Secondary | ICD-10-CM | POA: Diagnosis not present

## 2018-10-18 DIAGNOSIS — Y92410 Unspecified street and highway as the place of occurrence of the external cause: Secondary | ICD-10-CM | POA: Insufficient documentation

## 2018-10-18 DIAGNOSIS — I10 Essential (primary) hypertension: Secondary | ICD-10-CM | POA: Insufficient documentation

## 2018-10-18 DIAGNOSIS — Z79899 Other long term (current) drug therapy: Secondary | ICD-10-CM | POA: Diagnosis not present

## 2018-10-18 DIAGNOSIS — S199XXA Unspecified injury of neck, initial encounter: Secondary | ICD-10-CM | POA: Diagnosis present

## 2018-10-18 DIAGNOSIS — S161XXA Strain of muscle, fascia and tendon at neck level, initial encounter: Secondary | ICD-10-CM

## 2018-10-18 MED ORDER — METHOCARBAMOL 500 MG PO TABS: 500.0000 mg | ORAL_TABLET | Freq: Four times a day (QID) | ORAL | 0 refills | Status: DC
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
## 2018-10-18 NOTE — Discharge Instructions (Signed)
Follow-up with your regular doctor or [facility] clinic orthopedics if not better in 5 to 7 days.  Take the medication as prescribed.  Also take Tylenol for pain if needed.  Apply ice to all areas that hurt.  Return if worsening or new injuries arise.
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
## 2018-12-15 ENCOUNTER — Other Ambulatory Visit: Payer: Self-pay

## 2018-12-15 ENCOUNTER — Ambulatory Visit: Payer: BC Managed Care – PPO | Attending: Student | Admitting: Physical Therapy

## 2018-12-15 ENCOUNTER — Encounter: Payer: Self-pay | Admitting: Physical Therapy

## 2018-12-15 DIAGNOSIS — M25512 Pain in left shoulder: Secondary | ICD-10-CM | POA: Insufficient documentation

## 2018-12-15 DIAGNOSIS — R202 Paresthesia of skin: Secondary | ICD-10-CM | POA: Insufficient documentation

## 2018-12-15 DIAGNOSIS — M6281 Muscle weakness (generalized): Secondary | ICD-10-CM | POA: Diagnosis present

## 2018-12-15 DIAGNOSIS — M5412 Radiculopathy, cervical region: Secondary | ICD-10-CM | POA: Diagnosis not present
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
## 2018-12-16 NOTE — Addendum Note (Signed)
Addended by: Rosita Kea R on: 12/16/2018 11:49 AM   Modules accepted: Orders

## 2018-12-21 ENCOUNTER — Encounter: Payer: Self-pay | Admitting: Physical Therapy

## 2018-12-21 ENCOUNTER — Ambulatory Visit: Payer: BC Managed Care – PPO | Admitting: Physical Therapy

## 2018-12-21 ENCOUNTER — Other Ambulatory Visit: Payer: Self-pay

## 2018-12-21 DIAGNOSIS — R202 Paresthesia of skin: Secondary | ICD-10-CM

## 2018-12-21 DIAGNOSIS — M5412 Radiculopathy, cervical region: Secondary | ICD-10-CM

## 2018-12-21 DIAGNOSIS — M6281 Muscle weakness (generalized): Secondary | ICD-10-CM

## 2018-12-21 DIAGNOSIS — M25512 Pain in left shoulder: Secondary | ICD-10-CM
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
## 2018-12-22 ENCOUNTER — Encounter: Payer: BC Managed Care – PPO | Admitting: Physical Therapy

## 2018-12-23 ENCOUNTER — Ambulatory Visit: Payer: BC Managed Care – PPO | Admitting: Physical Therapy

## 2018-12-23 ENCOUNTER — Encounter: Payer: Self-pay | Admitting: Physical Therapy

## 2018-12-23 ENCOUNTER — Other Ambulatory Visit: Payer: Self-pay

## 2018-12-23 DIAGNOSIS — M6281 Muscle weakness (generalized): Secondary | ICD-10-CM

## 2018-12-23 DIAGNOSIS — M5412 Radiculopathy, cervical region: Secondary | ICD-10-CM | POA: Diagnosis not present

## 2018-12-23 DIAGNOSIS — R202 Paresthesia of skin: Secondary | ICD-10-CM

## 2018-12-23 DIAGNOSIS — M25512 Pain in left shoulder: Secondary | ICD-10-CM
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
## 2018-12-29 ENCOUNTER — Encounter: Payer: Self-pay | Admitting: Physical Therapy

## 2018-12-29 ENCOUNTER — Ambulatory Visit: Payer: BC Managed Care – PPO | Admitting: Physical Therapy

## 2018-12-29 ENCOUNTER — Other Ambulatory Visit: Payer: Self-pay

## 2018-12-29 DIAGNOSIS — M6281 Muscle weakness (generalized): Secondary | ICD-10-CM

## 2018-12-29 DIAGNOSIS — M5412 Radiculopathy, cervical region: Secondary | ICD-10-CM

## 2018-12-29 DIAGNOSIS — M25512 Pain in left shoulder: Secondary | ICD-10-CM

## 2018-12-29 DIAGNOSIS — R202 Paresthesia of skin: Secondary | ICD-10-CM
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
## 2018-12-31 ENCOUNTER — Ambulatory Visit: Payer: BC Managed Care – PPO | Admitting: Physical Therapy

## 2018-12-31 ENCOUNTER — Encounter: Payer: Self-pay | Admitting: Physical Therapy

## 2018-12-31 ENCOUNTER — Other Ambulatory Visit: Payer: Self-pay

## 2018-12-31 DIAGNOSIS — M6281 Muscle weakness (generalized): Secondary | ICD-10-CM

## 2018-12-31 DIAGNOSIS — M25512 Pain in left shoulder: Secondary | ICD-10-CM

## 2018-12-31 DIAGNOSIS — R202 Paresthesia of skin: Secondary | ICD-10-CM

## 2018-12-31 DIAGNOSIS — M5412 Radiculopathy, cervical region: Secondary | ICD-10-CM
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
## 2019-01-05 ENCOUNTER — Encounter: Payer: BC Managed Care – PPO | Admitting: Physical Therapy

## 2019-01-11 ENCOUNTER — Encounter: Payer: BC Managed Care – PPO | Admitting: Physical Therapy

## 2019-01-13 ENCOUNTER — Encounter: Payer: BC Managed Care – PPO | Admitting: Physical Therapy

## 2019-01-18 ENCOUNTER — Encounter: Payer: BC Managed Care – PPO | Admitting: Physical Therapy

## 2019-01-21 ENCOUNTER — Encounter: Payer: BC Managed Care – PPO | Admitting: Physical Therapy

## 2019-01-25 ENCOUNTER — Encounter: Payer: BC Managed Care – PPO | Admitting: Physical Therapy

## 2019-01-28 ENCOUNTER — Encounter: Payer: BC Managed Care – PPO | Admitting: Physical Therapy

## 2019-09-18 ENCOUNTER — Encounter: Payer: Self-pay | Admitting: Emergency Medicine

## 2019-09-18 ENCOUNTER — Other Ambulatory Visit: Payer: Self-pay

## 2019-09-18 DIAGNOSIS — Z880 Allergy status to penicillin: Secondary | ICD-10-CM | POA: Diagnosis not present

## 2019-09-18 DIAGNOSIS — R110 Nausea: Secondary | ICD-10-CM | POA: Diagnosis not present

## 2019-09-18 DIAGNOSIS — Z7982 Long term (current) use of aspirin: Secondary | ICD-10-CM | POA: Insufficient documentation

## 2019-09-18 DIAGNOSIS — Z79899 Other long term (current) drug therapy: Secondary | ICD-10-CM | POA: Diagnosis not present

## 2019-09-18 DIAGNOSIS — J209 Acute bronchitis, unspecified: Secondary | ICD-10-CM | POA: Diagnosis not present

## 2019-09-18 DIAGNOSIS — Z87891 Personal history of nicotine dependence: Secondary | ICD-10-CM | POA: Diagnosis not present

## 2019-09-18 DIAGNOSIS — R42 Dizziness and giddiness: Secondary | ICD-10-CM | POA: Diagnosis not present

## 2019-09-18 DIAGNOSIS — E860 Dehydration: Secondary | ICD-10-CM | POA: Insufficient documentation

## 2019-09-18 DIAGNOSIS — I10 Essential (primary) hypertension: Secondary | ICD-10-CM | POA: Insufficient documentation

## 2019-09-18 DIAGNOSIS — R0602 Shortness of breath: Secondary | ICD-10-CM | POA: Diagnosis present

## 2019-09-18 LAB — CBC
HCT: 43.2 % (ref 39.0–52.0)
Hemoglobin: 15.3 g/dL (ref 13.0–17.0)
MCH: 32.1 pg (ref 26.0–34.0)
MCHC: 35.4 g/dL (ref 30.0–36.0)
MCV: 90.8 fL (ref 80.0–100.0)
Platelets: 304 10*3/uL (ref 150–400)
RBC: 4.76 MIL/uL (ref 4.22–5.81)
RDW: 12.5 % (ref 11.5–15.5)
WBC: 10.6 10*3/uL — ABNORMAL HIGH (ref 4.0–10.5)
nRBC: 0 % (ref 0.0–0.2)

## 2019-09-18 LAB — URINALYSIS, COMPLETE (UACMP) WITH MICROSCOPIC
Bacteria, UA: NONE SEEN
Bilirubin Urine: NEGATIVE
Glucose, UA: NEGATIVE mg/dL
Hgb urine dipstick: NEGATIVE
Ketones, ur: NEGATIVE mg/dL
Leukocytes,Ua: NEGATIVE
Nitrite: NEGATIVE
Protein, ur: NEGATIVE mg/dL
Specific Gravity, Urine: 1.006 (ref 1.005–1.030)
Squamous Epithelial / LPF: NONE SEEN (ref 0–5)
pH: 5 (ref 5.0–8.0)

## 2019-09-18 LAB — BASIC METABOLIC PANEL
Anion gap: 10 (ref 5–15)
BUN: 7 mg/dL (ref 6–20)
CO2: 23 mmol/L (ref 22–32)
Calcium: 8.8 mg/dL — ABNORMAL LOW (ref 8.9–10.3)
Chloride: 106 mmol/L (ref 98–111)
Creatinine, Ser: 0.93 mg/dL (ref 0.61–1.24)
GFR calc Af Amer: >60 mL/min (ref >60)
GFR calc non Af Amer: >60 mL/min (ref >60)
Glucose, Bld: 107 mg/dL — ABNORMAL HIGH (ref 70–99)
Potassium: 3.7 mmol/L (ref 3.5–5.1)
Sodium: 139 mmol/L (ref 135–145)

## 2019-09-18 MED ORDER — SODIUM CHLORIDE 0.9% FLUSH: 3.0000 mL | Freq: Once | INTRAVENOUS | Status: DC

## 2019-09-18 NOTE — ED Notes (Signed)
Pt placed in recliner in [place]. Pt updated on wait. Pt declines to wear mask consistently. Pt informed to please wear mask. Call bell given.
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
## 2019-09-18 NOTE — ED Notes (Signed)
Patient observed walking outside without difficulty or distress.

## 2019-09-18 NOTE — ED Notes (Signed)
Repeat VS obtained by this RN. Pt visualized in NAD, this RN apologized and explained delay to patient.

## 2019-09-19 ENCOUNTER — Emergency Department
Admission: EM | Admit: 2019-09-19 | Discharge: 2019-09-19 | Disposition: A | Discharge status: Home / Self Care | Payer: BC Managed Care – PPO | Attending: Emergency Medicine | Admitting: Emergency Medicine

## 2019-09-19 DIAGNOSIS — E860 Dehydration: Secondary | ICD-10-CM

## 2019-09-19 DIAGNOSIS — J40 Bronchitis, not specified as acute or chronic: Secondary | ICD-10-CM

## 2019-09-19 MED ORDER — HYDROCODONE-HOMATROPINE 5-1.5 MG/5ML PO SYRP: 5.0000 mL | ORAL_SOLUTION | Freq: Four times a day (QID) | ORAL | 0 refills | Status: DC | PRN

## 2019-09-19 MED ORDER — BUDESONIDE-FORMOTEROL FUMARATE 80-4.5 MCG/ACT IN AERO: 2.0000 | INHALATION_SPRAY | Freq: Two times a day (BID) | RESPIRATORY_TRACT | 0 refills | Status: DC

## 2019-09-19 MED ORDER — LEVOFLOXACIN 750 MG PO TABS: 750.0000 mg | ORAL_TABLET | Freq: Every day | ORAL | 0 refills | Status: AC

## 2019-09-19 MED ORDER — ALBUTEROL SULFATE HFA 108 (90 BASE) MCG/ACT IN AERS: 2.0000 | INHALATION_SPRAY | RESPIRATORY_TRACT | 0 refills | Status: AC | PRN

## 2019-09-19 NOTE — ED Provider Notes (Signed)
[facility] Emergency Department Provider Note  ____________________________________________   First MD Initiated Contact with Patient 09/19/19 [phone]     (approximate)  I have reviewed the triage vital signs and the nursing notes.   HISTORY  Chief Complaint Dizziness    HPI Daniel Harris is a 49 y.o. male  With h/o HTN, anxiety, here with cough, SOB, lightheadedness. Pt has been undergoing treatment for respiratory infection. He was prescribed prednisone and azithromycin for this. He feelsl ike after starting the prednisone he has had increasing anxiety, tremors, difficulty sleeping. He has also had some diarrhea since starting the azithromycin so he stopped this. He has been using an inhaler a few times daily as well. Over the last day, he's had several episodes In which he feels lightheaded, flushed, and anxious. These symptoms seem to come and go randomly but are worse when standing up. No alleviating factors. No CP, SOB. Called his PCP and was told to come in. No fever, chills. His cough has been persistent, with minimal improvement. He was tested for COVID and was negative. No other complaints.        Past Medical History:  Diagnosis Date  . Bleeding gastric ulcer   . Hypertension     Patient Active Problem List   Diagnosis Date Noted  . Adjustment disorder with anxiety 03/18/2016  . Generalized anxiety disorder 03/18/2016    History reviewed. No pertinent surgical history.  Prior to Admission medications   Medication Sig Start Date End Date Taking? Authorizing Provider  albuterol (VENTOLIN HFA) 108 (90 Base) MCG/ACT inhaler Inhale 2 puffs into the lungs every 4 (four) hours as needed for wheezing or shortness of breath. 09/19/19   Duffy Bruce, MD  budesonide-formoterol (SYMBICORT) 80-4.5 MCG/ACT inhaler Inhale 2 puffs into the lungs in the morning and at bedtime. 09/19/19 10/19/19  Duffy Bruce, MD  HYDROcodone-homatropine [facility]) 5-1.5  MG/5ML syrup Take 5 mLs by mouth every 6 (six) hours as needed for cough. 09/19/19   Duffy Bruce, MD  levofloxacin (LEVAQUIN) 750 MG tablet Take 1 tablet (750 mg total) by mouth daily for 5 days. 09/19/19 09/24/19  Duffy Bruce, MD  losartan (COZAAR) 50 MG tablet Take 50 mg by mouth daily.    [provider]  methocarbamol (ROBAXIN) 500 MG tablet Take 1 tablet (500 mg total) by mouth 4 (four) times daily. Patient not taking: Reported on 12/15/2018 10/18/18   Versie Starks, PA-C    Allergies Amoxapine, Morphine, Morphine and related, Amoxicillin, Aspirin, and Penicillins  No family history on file.  Social History Social History   Tobacco Use  . Smoking status: Former Smoker    Types: Cigarettes  Substance Use Topics  . Alcohol use: No  . Drug use: No    Review of Systems  Review of Systems  Constitutional: Positive for fatigue. Negative for chills and fever.  HENT: Negative for sore throat.   Respiratory: Positive for cough, shortness of breath and wheezing.   Cardiovascular: Negative for chest pain.  Gastrointestinal: Positive for nausea. Negative for abdominal pain.  Genitourinary: Negative for flank pain.  Musculoskeletal: Negative for neck pain.  Skin: Negative for rash and wound.  Allergic/Immunologic: Negative for immunocompromised state.  Neurological: Positive for light-headedness. Negative for weakness and numbness.  Hematological: Does not bruise/bleed easily.  All other systems reviewed and are negative.    ____________________________________________  PHYSICAL EXAM:      VITAL SIGNS: ED Triage Vitals  Enc Vitals Group  BP 09/18/19 1310 (!) 155/109     Pulse Rate 09/18/19 1310 91     Resp 09/18/19 1310 18     Temp 09/18/19 1310 98.6 F (37 C)     Temp Source 09/18/19 1310 Oral     SpO2 09/18/19 1310 98 %     Weight 09/18/19 1311 165 lb (74.8 kg)     Height 09/18/19 1311 5\' 6"  (1.676 m)     Head Circumference --      Peak Flow --       Pain Score 09/18/19 1311 0     Pain Loc --      Pain Edu? --      Excl. in [place]? --      Physical Exam Vitals and nursing note reviewed.  Constitutional:      General: He is not in acute distress.    Appearance: He is well-developed.  HENT:     Head: Normocephalic and atraumatic.     Mouth/Throat:     Mouth: Mucous membranes are dry.  Eyes:     Conjunctiva/sclera: Conjunctivae normal.  Cardiovascular:     Rate and Rhythm: Normal rate and regular rhythm.     Heart sounds: Normal heart sounds.  Pulmonary:     Effort: Pulmonary effort is normal. No respiratory distress.     Breath sounds: Wheezing present.     Comments: Course rhonchi with wheezing b/l, normal WOB and speaking in full sentences, however Abdominal:     General: Abdomen is flat. There is no distension.  Musculoskeletal:     Cervical back: Neck supple.  Skin:    General: Skin is warm.     Capillary Refill: Capillary refill takes less than 2 seconds.     Findings: No rash.  Neurological:     Mental Status: He is alert and oriented to person, place, and time.     Motor: No abnormal muscle tone.       ____________________________________________   LABS (all labs ordered are listed, but only abnormal results are displayed)  Labs Reviewed  BASIC METABOLIC PANEL - Abnormal; Notable for the following components:      Result Value   Glucose, Bld 107 (*)    Calcium 8.8 (*)    All other components within normal limits  CBC - Abnormal; Notable for the following components:   WBC 10.6 (*)    All other components within normal limits  URINALYSIS, COMPLETE (UACMP) WITH MICROSCOPIC - Abnormal; Notable for the following components:   Color, Urine STRAW (*)    APPearance CLEAR (*)    All other components within normal limits  CBG MONITORING, ED    ____________________________________________  EKG: Normal sinus rhythm, VR 93. PR 172, QRS 108, QTc 442. No acute St elevatiosn or  depressions. ________________________________________  RADIOLOGY All imaging, including plain films, CT scans, and ultrasounds, independently reviewed by me, and interpretations confirmed via formal radiology reads.  ED MD interpretation:     Official radiology report(s): No results found.  ____________________________________________  PROCEDURES   Procedure(s) performed (including Critical Care):  Procedures  ____________________________________________  INITIAL IMPRESSION / MDM / [place] / ED COURSE  As part of my medical decision making, I reviewed the following data within the [place] notes reviewed and incorporated, Old chart reviewed, Notes from prior ED visits, and [place] Controlled Substance Database       *Daniel Harris was evaluated in Emergency Department on 09/19/2019 for the symptoms described  in the history of present illness. He was evaluated in the context of the global COVID-19 pandemic, which necessitated consideration that the patient might be at risk for infection with the SARS-CoV-2 virus that causes COVID-19. Institutional protocols and algorithms that pertain to the evaluation of patients at risk for COVID-19 are in a state of rapid change based on information released by regulatory bodies including the CDC and federal and state organizations. These policies and algorithms were followed during the patient's care in the ED.  Some ED evaluations and interventions may be delayed as a result of limited staffing during the pandemic.*     Medical Decision Making:  49 yo M here with ongoing cough, wheezing, intermittent lightheadedness. Suspect ongoing bronchitis/CAP in setting of possible underlying RAD. Re: his lightheadedness, suspect this is a combination of mild dehydration with orthostasis, as well as possible adverse effect to prednisone causing increased anxiety. EKG shows NSR, normal intervals, no arrhythmia. Labs  obtained and are unremarkable - lytes wnl, no significant leukocytosis or anemia, UA unremarkable. Pt requesting d/c on my assessment which I think is reasonable given his otherwise stable vitals, reassuring exam and hstory. Will switch him to a steroid inhaler given his adverse rxn to prednisone, as he does have wheezing and exam c/f RAD. Will also have him hydrate at home, increase hsi albuterol usage. I do feel he would benefit from ABX as well - he refuses to take penicillins or azithro, so will switch to levaquin for broad coverage. Will have him f/u with PCP.   ____________________________________________  FINAL CLINICAL IMPRESSION(S) / ED DIAGNOSES  Final diagnoses:  Bronchitis  Dehydration     MEDICATIONS GIVEN DURING THIS VISIT:  Medications  sodium chloride flush (NS) 0.9 % injection 3 mL (has no administration in time range)     ED Discharge Orders         Ordered    budesonide-formoterol (SYMBICORT) 80-4.5 MCG/ACT inhaler  2 times daily     Discontinue  Reprint     09/19/19 0154    albuterol (VENTOLIN HFA) 108 (90 Base) MCG/ACT inhaler  Every 4 hours PRN     Discontinue  Reprint     09/19/19 0154    levofloxacin (LEVAQUIN) 750 MG tablet  Daily     Discontinue  Reprint     09/19/19 0154    HYDROcodone-homatropine (HYCODAN) 5-1.5 MG/5ML syrup  Every 6 hours PRN     Discontinue  Reprint     09/19/19 0154           Note:  This document was prepared using Dragon voice recognition software and may include unintentional dictation errors.   Duffy Bruce, MD 09/19/19 [phone]

## 2019-09-19 NOTE — Discharge Instructions (Addendum)
STOP the Azithromycin and Prednisone  START the steroid inhaler twice a day for the next 7-10 days  START the albuterol inhaler every 4 hours when awake for the next 2-3 days, then as needed  START the antibiotic as prescribed

## 2019-09-19 NOTE — ED Notes (Signed)
Pt to recliner in triage 3 pending MD evaluation. Pt coughing nearly constantly and refuses to wear mask.

## 2020-06-24 ENCOUNTER — Other Ambulatory Visit: Payer: Self-pay

## 2020-06-24 ENCOUNTER — Encounter: Payer: Self-pay | Admitting: Emergency Medicine

## 2020-06-24 DIAGNOSIS — Z87891 Personal history of nicotine dependence: Secondary | ICD-10-CM | POA: Insufficient documentation

## 2020-06-24 DIAGNOSIS — N200 Calculus of kidney: Secondary | ICD-10-CM | POA: Diagnosis not present

## 2020-06-24 DIAGNOSIS — I10 Essential (primary) hypertension: Secondary | ICD-10-CM | POA: Insufficient documentation

## 2020-06-24 DIAGNOSIS — R109 Unspecified abdominal pain: Secondary | ICD-10-CM | POA: Diagnosis present

## 2020-06-24 DIAGNOSIS — Z79899 Other long term (current) drug therapy: Secondary | ICD-10-CM | POA: Diagnosis not present

## 2020-06-24 LAB — URINALYSIS, COMPLETE (UACMP) WITH MICROSCOPIC
Bacteria, UA: NONE SEEN
Bilirubin Urine: NEGATIVE
Glucose, UA: NEGATIVE mg/dL
Hgb urine dipstick: NEGATIVE
Ketones, ur: NEGATIVE mg/dL
Leukocytes,Ua: NEGATIVE
Nitrite: NEGATIVE
Protein, ur: NEGATIVE mg/dL
Specific Gravity, Urine: 1.036 — ABNORMAL HIGH (ref 1.005–1.030)
Squamous Epithelial / LPF: NONE SEEN (ref 0–5)
pH: 5 (ref 5.0–8.0)

## 2020-06-24 NOTE — ED Triage Notes (Signed)
Pt reports that he developed LLQ pain that began this evening. Pt is having trouble urinating, denies seeing any blood at this time. He has hx of kidney stones and this feels similar.

## 2020-06-25 ENCOUNTER — Emergency Department
Admission: EM | Admit: 2020-06-25 | Discharge: 2020-06-25 | Disposition: A | Discharge status: Home / Self Care | Payer: BC Managed Care – PPO | Attending: Emergency Medicine | Admitting: Emergency Medicine

## 2020-06-25 ENCOUNTER — Emergency Department: Payer: BC Managed Care – PPO

## 2020-06-25 DIAGNOSIS — N200 Calculus of kidney: Secondary | ICD-10-CM

## 2020-06-25 LAB — COMPREHENSIVE METABOLIC PANEL
ALT: 33 U/L (ref 0–44)
AST: 27 U/L (ref 15–41)
Albumin: 4.4 g/dL (ref 3.5–5.0)
Alkaline Phosphatase: 94 U/L (ref 38–126)
Anion gap: 12 (ref 5–15)
BUN: 12 mg/dL (ref 6–20)
CO2: 19 mmol/L — ABNORMAL LOW (ref 22–32)
Calcium: 8.8 mg/dL — ABNORMAL LOW (ref 8.9–10.3)
Chloride: 107 mmol/L (ref 98–111)
Creatinine, Ser: 1.36 mg/dL — ABNORMAL HIGH (ref 0.61–1.24)
GFR, Estimated: >60 mL/min (ref >60)
Glucose, Bld: 120 mg/dL — ABNORMAL HIGH (ref 70–99)
Potassium: 4 mmol/L (ref 3.5–5.1)
Sodium: 138 mmol/L (ref 135–145)
Total Bilirubin: 0.7 mg/dL (ref 0.3–1.2)
Total Protein: 6.7 g/dL (ref 6.5–8.1)

## 2020-06-25 LAB — CBC WITH DIFFERENTIAL/PLATELET
Abs Immature Granulocytes: 0.1 10*3/uL — ABNORMAL HIGH (ref 0.00–0.07)
Basophils Absolute: 0.1 10*3/uL (ref 0.0–0.1)
Basophils Relative: 0 %
Eosinophils Absolute: 0 10*3/uL (ref 0.0–0.5)
Eosinophils Relative: 0 %
HCT: 44 % (ref 39.0–52.0)
Hemoglobin: 15.9 g/dL (ref 13.0–17.0)
Immature Granulocytes: 1 %
Lymphocytes Relative: 9 %
Lymphs Abs: 1.9 10*3/uL (ref 0.7–4.0)
MCH: 32.7 pg (ref 26.0–34.0)
MCHC: 36.1 g/dL — ABNORMAL HIGH (ref 30.0–36.0)
MCV: 90.5 fL (ref 80.0–100.0)
Monocytes Absolute: 1.3 10*3/uL — ABNORMAL HIGH (ref 0.1–1.0)
Monocytes Relative: 6 %
Neutro Abs: 18.1 10*3/uL — ABNORMAL HIGH (ref 1.7–7.7)
Neutrophils Relative %: 84 %
Platelets: 254 10*3/uL (ref 150–400)
RBC: 4.86 MIL/uL (ref 4.22–5.81)
RDW: 12.4 % (ref 11.5–15.5)
WBC: 21.4 10*3/uL — ABNORMAL HIGH (ref 4.0–10.5)
nRBC: 0 % (ref 0.0–0.2)

## 2020-06-25 MED ORDER — KETOROLAC TROMETHAMINE 30 MG/ML IJ SOLN
30.0000 mg | Freq: Once | INTRAMUSCULAR | Status: AC
  Administered 2020-06-25: 30 mg via INTRAVENOUS
  Filled 2020-06-25: qty 1

## 2020-06-25 MED ORDER — HYDROMORPHONE HCL 1 MG/ML IJ SOLN
1.0000 mg | Freq: Once | INTRAMUSCULAR | Status: AC
  Administered 2020-06-25: 1 mg via INTRAVENOUS
  Filled 2020-06-25: qty 1

## 2020-06-25 MED ORDER — TAMSULOSIN HCL 0.4 MG PO CAPS: 0.4000 mg | ORAL_CAPSULE | Freq: Every day | ORAL | 0 refills | Status: DC

## 2020-06-25 MED ORDER — ONDANSETRON 4 MG PO TBDP: 4.0000 mg | ORAL_TABLET | Freq: Four times a day (QID) | ORAL | 0 refills | Status: DC | PRN

## 2020-06-25 MED ORDER — OXYCODONE-ACETAMINOPHEN 5-325 MG PO TABS: 2.0000 | ORAL_TABLET | Freq: Four times a day (QID) | ORAL | 0 refills | Status: AC | PRN

## 2020-06-25 MED ORDER — ONDANSETRON HCL 4 MG/2ML IJ SOLN
4.0000 mg | Freq: Once | INTRAMUSCULAR | Status: AC
  Administered 2020-06-25: 4 mg via INTRAVENOUS
  Filled 2020-06-25: qty 2

## 2020-06-25 MED ORDER — SODIUM CHLORIDE 0.9 % IV BOLUS (SEPSIS)
1000.0000 mL | Freq: Once | INTRAVENOUS | Status: AC
  Administered 2020-06-25: 1000 mL via INTRAVENOUS

## 2020-06-25 NOTE — ED Notes (Signed)
Dr. Leonides Schanz notified patient found sitting on the ground, vomiting into toilet. Pt reports he has a history of kidney stones, states "it feels just like when I had those kidney stones a few years ago, I'm only throwing up because I hurt so bad". Awaiting orders.

## 2020-06-25 NOTE — Discharge Instructions (Signed)

## 2020-06-25 NOTE — ED Provider Notes (Signed)
[facility] Emergency Department Provider Note  ____________________________________________   Event Date/Time   First MD Initiated Contact with Patient 06/25/20 [phone]     (approximate)  I have reviewed the triage vital signs and the nursing notes.   HISTORY  Chief Complaint Flank Pain    HPI Daniel Harris is a 50 y.o. male with history of hypertension, gastric ulcers, kidney stones who presents to the emergency department with complaints of left-sided sharp severe abdominal pain that started tonight.  He has had nausea and vomiting.  Feels similar to his previous kidney stones.  No fevers.  Has seen hematuria tonight.  No dysuria.  No diarrhea.  Denies penile discharge.  No testicular pain or swelling.  Has never had to have surgery for his kidney stones before.  States he is always passed them.        Past Medical History:  Diagnosis Date  . Bleeding gastric ulcer   . Hypertension     Patient Active Problem List   Diagnosis Date Noted  . Adjustment disorder with anxiety 03/18/2016  . Generalized anxiety disorder 03/18/2016    History reviewed. No pertinent surgical history.  Prior to Admission medications   Medication Sig Start Date End Date Taking? Authorizing Provider  ondansetron (ZOFRAN ODT) 4 MG disintegrating tablet Take 1 tablet (4 mg total) by mouth every 6 (six) hours as needed for nausea or vomiting. 06/25/20  Yes Alika Saladin, Delice Bison, DO  oxyCODONE-acetaminophen (PERCOCET) 5-325 MG tablet Take 2 tablets by mouth every 6 (six) hours as needed for severe pain. 06/25/20 06/25/21 Yes Janessa Mickle, Delice Bison, DO  tamsulosin (FLOMAX) 0.4 MG CAPS capsule Take 1 capsule (0.4 mg total) by mouth daily. 06/25/20  Yes Malisha Mabey, Cyril Mourning N, DO  albuterol (VENTOLIN HFA) 108 (90 Base) MCG/ACT inhaler Inhale 2 puffs into the lungs every 4 (four) hours as needed for wheezing or shortness of breath. 09/19/19   Duffy Bruce, MD  budesonide-formoterol (SYMBICORT) 80-4.5  MCG/ACT inhaler Inhale 2 puffs into the lungs in the morning and at bedtime. 09/19/19 10/19/19  Duffy Bruce, MD  HYDROcodone-homatropine [facility]) 5-1.5 MG/5ML syrup Take 5 mLs by mouth every 6 (six) hours as needed for cough. 09/19/19   Duffy Bruce, MD  losartan (COZAAR) 50 MG tablet Take 50 mg by mouth daily.    [provider]  methocarbamol (ROBAXIN) 500 MG tablet Take 1 tablet (500 mg total) by mouth 4 (four) times daily. Patient not taking: Reported on 12/15/2018 10/18/18   Versie Starks, PA-C    Allergies Amoxapine, Morphine, Morphine and related, Amoxicillin, Aspirin, and Penicillins  No family history on file.  Social History Social History   Tobacco Use  . Smoking status: Former Smoker    Types: Cigarettes  Substance Use Topics  . Alcohol use: No  . Drug use: No    Review of Systems Constitutional: No fever. Eyes: No visual changes. ENT: No sore throat. Cardiovascular: Denies chest pain. Respiratory: Denies shortness of breath. Gastrointestinal: + nausea and vomiting. Genitourinary: Negative for dysuria.  + hematuria. Musculoskeletal: Negative for back pain. Skin: Negative for rash. Neurological: Negative for focal weakness or numbness.  ____________________________________________   PHYSICAL EXAM:  VITAL SIGNS: ED Triage Vitals  Enc Vitals Group     BP 06/24/20 2253 132/68     Pulse Rate 06/24/20 2253 78     Resp 06/24/20 2253 18     Temp 06/24/20 2253 98.7 F (37.1 C)     Temp Source 06/24/20 2253  Oral     SpO2 06/24/20 2253 98 %     Weight 06/24/20 2245 183 lb (83 kg)     Height 06/24/20 2245 5\' 6"  (1.676 m)     Head Circumference --      Peak Flow --      Pain Score 06/24/20 2245 8     Pain Loc --      Pain Edu? --      Excl. in [place]? --    CONSTITUTIONAL: Alert and oriented and responds appropriately to questions.  Appears uncomfortable.  Afebrile.  Nontoxic. HEAD: Normocephalic EYES: Conjunctivae clear, pupils appear equal, EOM  appear intact ENT: normal nose; moist mucous membranes NECK: Supple, normal ROM CARD: RRR; S1 and S2 appreciated; no murmurs, no clicks, no rubs, no gallops RESP: Normal chest excursion without splinting or tachypnea; breath sounds clear and equal bilaterally; no wheezes, no rhonchi, no rales, no hypoxia or respiratory distress, speaking full sentences ABD/GI: Normal bowel sounds; non-distended; soft, tender in the left lower quadrant without guarding or rebound, no peritoneal signs BACK: The back appears normal, no CVA tenderness EXT: Normal ROM in all joints; no deformity noted, no edema; no cyanosis SKIN: Normal color for age and race; warm; no rash on exposed skin NEURO: Moves all extremities equally PSYCH: The patient's mood and manner are appropriate.  ____________________________________________   LABS (all labs ordered are listed, but only abnormal results are displayed)  Labs Reviewed  URINALYSIS, COMPLETE (UACMP) WITH MICROSCOPIC - Abnormal; Notable for the following components:      Result Value   Color, Urine YELLOW (*)    APPearance CLEAR (*)    Specific Gravity, Urine 1.036 (*)    All other components within normal limits  CBC WITH DIFFERENTIAL/PLATELET - Abnormal; Notable for the following components:   WBC 21.4 (*)    MCHC 36.1 (*)    Neutro Abs 18.1 (*)    Monocytes Absolute 1.3 (*)    Abs Immature Granulocytes 0.10 (*)    All other components within normal limits  COMPREHENSIVE METABOLIC PANEL - Abnormal; Notable for the following components:   CO2 19 (*)    Glucose, Bld 120 (*)    Creatinine, Ser 1.36 (*)    Calcium 8.8 (*)    All other components within normal limits   ____________________________________________  EKG   ____________________________________________  RADIOLOGY I, Antoinetta Berrones, personally viewed and evaluated these images (plain radiographs) as part of my medical decision making, as well as reviewing the written report by the  radiologist.  ED MD interpretation: 2 left sided ureteral stones seen that are 4 mm each.  Official radiology report(s): CT Renal Stone Study  Result Date: 06/25/2020 CLINICAL DATA:  Left lower quadrant/flank pain. EXAM: CT ABDOMEN AND PELVIS WITHOUT CONTRAST TECHNIQUE: Multidetector CT imaging of the abdomen and pelvis was performed following the standard protocol without IV contrast. COMPARISON:  March 12, 2009 FINDINGS: Lower chest: Bibasilar atelectasis/scarring. Normal size heart. No significant pericardial effusion/thickening. Small hiatal hernia. Hepatobiliary: Unremarkable noncontrast appearance of the hepatic parenchyma. Gallbladder is unremarkable. No biliary ductal dilation. Pancreas: Within normal limits. Spleen: Within normal limits. Adrenals/Urinary Tract: Bilateral adrenal glands are unremarkable. LEFT perinephric and Peri ureteral stranding with mild hydroureteronephrosis to the level of a 4 mm stone in the distal ureter just proximal to the UVJ with another 4 mm stone in the UVJ. Punctate nonobstructive left lower pole renal stone. Punctate nonobstructive right renal calculi measuring up to 2-3 mm. No right-sided hydronephrosis. Urinary bladder  is decompressed limiting evaluation. Stomach/Bowel: Tiny hiatal hernia otherwise the stomach is grossly unremarkable for degree of distension. Normal positioning of the duodenum/ligament of [place]. No pathologic dilation of small bowel. The appendix and terminal ileum are grossly unremarkable. Colonic diverticulosis without findings of acute diverticulitis. Vascular/Lymphatic: Aortic atherosclerosis. No enlarged abdominal or pelvic lymph nodes. Reproductive: Prostate is unremarkable. Other: No abdominopelvic ascites.  No pneumoperitoneum. Musculoskeletal: Slightly increased size of the intermediate density well-circumscribed lesion which extends from the right L3-L4 neural foramina measuring approximately 3.8 x 2.4 cm on image 53/2 previously  measuring 2.6 x 1.9 cm with a central focus of calcification, with mild widening of the neural foramina. No acute osseous abnormality. IMPRESSION: 1. Mild left hydroureteronephrosis to the level of a 4 mm stone in the distal ureter just proximal to the UVJ with another 4 mm stone in the UVJ, recommend correlation with urinalysis to assess for superimposed infection. 2. Punctate nonobstructive bilateral renal calculi. 3. Colonic diverticulosis without findings of acute diverticulitis. 4. Slightly increased size of a well-circumscribed intermediate density lesion which extends from the right L3-L4 neural foramina measuring approximately 3.8 x 2.4 cm with a central focus of calcification, and mild widening of the neural foramina. 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 Further evaluation with nonemergent outpatient lumbar spine MRI is suggested. 5. Small hiatal hernia. 6. Aortic atherosclerosis.  Aortic Atherosclerosis (ICD10-I70.0). Electronically Signed   By: Dahlia Bailiff MD   On: 06/25/2020 01:57    ____________________________________________   PROCEDURES  Procedure(s) performed (including Critical Care):  Procedures   ____________________________________________   INITIAL IMPRESSION / ASSESSMENT AND PLAN / ED COURSE  As part of my medical decision making, I reviewed the following data within the [place] History obtained from family, Nursing notes reviewed and incorporated, Labs reviewed , Old chart reviewed, Radiograph reviewed , Notes from prior ED visits and [place] Controlled Substance Database         Patient here with complaints of left lower abdominal pain.  States it feels like his previous kidney stones.  Urine obtained in triage is unremarkable without signs of infection or blood.  I will proceed with labs, CT of abdomen pelvis.  No history of chronic kidney disease.  Will give IV fluids, Toradol, Dilaudid, Zofran.  ED PROGRESS  Patient's labs show  leukocytosis of 21,000 with left shift.  He is not having any fevers and his urine does not appear infected today.  I suspect that this is likely reactive in nature.  He does have a minimally bumped creatinine of 1.36.  He did receive Toradol here but will avoid any further nephrotoxic medications.  Will give IV fluids.  Reports pain is well controlled and he is tolerating p.o. here.  His CT scan shows mild left hydroureteronephrosis with a 4 mm stone in the distal ureter just proximal to the UVJ and another 4 mm stone at the UVJ.  Given he is well-appearing, tolerating p.o., afebrile, I feel he is safe to be discharged home with pain medication and outpatient follow-up.  He is also comfortable with this plan stating that he has passed stones successfully on his own in the past and given the size of the stones I think he will be able to pass them again today.  He states he has had 3 stones in his ureter at the same time once before and was able to pass them without intervention.  I did discuss at length return precautions with patient and wife.  They are comfortable with this plan.  Given outpatient urologic follow-up. ____________________________________________   FINAL CLINICAL IMPRESSION(S) / ED DIAGNOSES  Final diagnoses:  Kidney stone     ED Discharge Orders         Ordered    oxyCODONE-acetaminophen (PERCOCET) 5-325 MG tablet  Every 6 hours PRN        06/25/20 0351    ondansetron (ZOFRAN ODT) 4 MG disintegrating tablet  Every 6 hours PRN        06/25/20 0351    tamsulosin (FLOMAX) 0.4 MG CAPS capsule  Daily        06/25/20 0351          *Please note:  Daniel Harris was evaluated in Emergency Department on 06/25/2020 for the symptoms described in the history of present illness. He was evaluated in the context of the global COVID-19 pandemic, which necessitated consideration that the patient might be at risk for infection with the SARS-CoV-2 virus that causes COVID-19. Institutional  protocols and algorithms that pertain to the evaluation of patients at risk for COVID-19 are in a state of rapid change based on information released by regulatory bodies including the CDC and federal and state organizations. These policies and algorithms were followed during the patient's care in the ED.  Some ED evaluations and interventions may be delayed as a result of limited staffing during and the pandemic.*   Note:  This document was prepared using Dragon voice recognition software and may include unintentional dictation errors.   Saryna Kneeland, Delice Bison, DO 06/25/20 [phone]

## 2021-06-07 ENCOUNTER — Other Ambulatory Visit: Payer: Self-pay | Admitting: Family Medicine

## 2021-06-07 DIAGNOSIS — M5412 Radiculopathy, cervical region: Secondary | ICD-10-CM

## 2021-06-25 ENCOUNTER — Ambulatory Visit
Admission: RE | Admit: 2021-06-25 | Discharge: 2021-06-25 | Disposition: A | Discharge status: Home / Self Care | Payer: BC Managed Care – PPO | Source: Ambulatory Visit | Attending: Family Medicine | Admitting: Family Medicine

## 2021-06-25 DIAGNOSIS — M5412 Radiculopathy, cervical region: Secondary | ICD-10-CM

## 2021-08-16 ENCOUNTER — Other Ambulatory Visit: Payer: Self-pay | Admitting: Family Medicine

## 2021-08-16 ENCOUNTER — Ambulatory Visit: Payer: Self-pay

## 2021-08-16 DIAGNOSIS — M79641 Pain in right hand: Secondary | ICD-10-CM

## 2021-09-03 ENCOUNTER — Other Ambulatory Visit: Payer: Self-pay | Admitting: Family Medicine

## 2021-09-03 DIAGNOSIS — M5416 Radiculopathy, lumbar region: Secondary | ICD-10-CM

## 2021-09-03 DIAGNOSIS — M5136 Other intervertebral disc degeneration, lumbar region: Secondary | ICD-10-CM

## 2021-09-03 DIAGNOSIS — M62830 Muscle spasm of back: Secondary | ICD-10-CM

## 2021-09-12 ENCOUNTER — Ambulatory Visit
Admission: RE | Admit: 2021-09-12 | Discharge: 2021-09-12 | Disposition: A | Discharge status: Home / Self Care | Payer: BC Managed Care – PPO | Source: Ambulatory Visit | Attending: Family Medicine | Admitting: Family Medicine

## 2021-09-12 DIAGNOSIS — M5416 Radiculopathy, lumbar region: Secondary | ICD-10-CM

## 2021-09-12 DIAGNOSIS — M62830 Muscle spasm of back: Secondary | ICD-10-CM

## 2021-09-12 DIAGNOSIS — M5136 Other intervertebral disc degeneration, lumbar region: Secondary | ICD-10-CM

## 2021-09-19 ENCOUNTER — Telehealth: Payer: Self-pay

## 2021-09-19 NOTE — Telephone Encounter (Signed)
We have gotten a referral from [facility] on this patient. Dr. Izora Ribas has reviewed this and said it needs to go to Dr. Tyler Pita. I have faxed this back to them with this information on it.

## 2021-10-12 ENCOUNTER — Other Ambulatory Visit: Payer: Self-pay | Admitting: Neurosurgery

## 2021-10-23 NOTE — Pre-Procedure Instructions (Signed)
Surgical Instructions    Your procedure is scheduled on October 26, 2021.  Report to [facility] Main Entrance "A" at 6:00 A.M., then check in with the Admitting office.  Call this number if you have problems the morning of surgery:  [phone]   If you have any questions prior to your surgery date call [phone]: Open Monday-Friday 8am-4pm    Remember:  Do not eat or drink after midnight the night before your surgery      Take these medicines the morning of surgery with A SIP OF WATER:  ALPRAZolam (XANAX XR)  citalopram (CELEXA)   Take these medicines the morning of surgery AS NEEDED:  albuterol (VENTOLIN HFA) inhaler  methocarbamol (ROBAXIN)  traMADol (ULTRAM)   As of today, STOP taking any Aspirin (unless otherwise instructed by your surgeon) Aleve, Naproxen, Ibuprofen, Motrin, Advil, Goody's, BC's, all herbal medications, fish oil, and all vitamins.                     Do NOT Smoke (Tobacco/Vaping) for 24 hours prior to your procedure.  If you use a CPAP at night, you may bring your mask/headgear for your overnight stay.   Contacts, glasses, piercing's, hearing aid's, dentures or partials may not be worn into surgery, please bring cases for these belongings.    For patients admitted to the hospital, discharge time will be determined by your treatment team.   Patients discharged the day of surgery will not be allowed to drive home, and someone needs to stay with them for 24 hours.  SURGICAL WAITING ROOM VISITATION Patients having surgery or a procedure may have no more than 2 support people in the waiting area - these visitors may rotate.   Children under the age of 24 must have an adult with them who is not the patient. If the patient needs to stay at the hospital during part of their recovery, the visitor guidelines for inpatient rooms apply. Pre-op nurse will coordinate an appropriate time for 1 support person to accompany patient in pre-op.  This support person  may not rotate.   Please refer to the [facility] website for the visitor guidelines for Inpatients (after your surgery is over and you are in a regular room).    Special instructions:   [facility]- Preparing For Surgery  Before surgery, you can play an important role. Because skin is not sterile, your skin needs to be as free of germs as possible. You can reduce the number of germs on your skin by washing with CHG (chlorahexidine gluconate) Soap before surgery.  CHG is an antiseptic cleaner which kills germs and bonds with the skin to continue killing germs even after washing.    Oral Hygiene is also important to reduce your risk of infection.  Remember - BRUSH YOUR TEETH THE MORNING OF SURGERY WITH YOUR REGULAR TOOTHPASTE  Please do not use if you have an allergy to CHG or antibacterial soaps. If your skin becomes reddened/irritated stop using the CHG.  Do not shave (including legs and underarms) for at least 48 hours prior to first CHG shower. It is OK to shave your face.  Please follow these instructions carefully.   Shower the NIGHT BEFORE SURGERY and the MORNING OF SURGERY  If you chose to wash your hair, wash your hair first as usual with your normal shampoo.  After you shampoo, rinse your hair and body thoroughly to remove the shampoo.  Use CHG Soap as you would any other liquid  soap. You can apply CHG directly to the skin and wash gently with a scrungie or a clean washcloth.   Apply the CHG Soap to your body ONLY FROM THE NECK DOWN.  Do not use on open wounds or open sores. Avoid contact with your eyes, ears, mouth and genitals (private parts). Wash Face and genitals (private parts)  with your normal soap.   Wash thoroughly, paying special attention to the area where your surgery will be performed.  Thoroughly rinse your body with warm water from the neck down.  DO NOT shower/wash with your normal soap after using and rinsing off the CHG Soap.  Pat yourself dry with a CLEAN  TOWEL.  Wear CLEAN PAJAMAS to bed the night before surgery  Place CLEAN SHEETS on your bed the night before your surgery  DO NOT SLEEP WITH PETS.   Day of Surgery: Take a shower with CHG soap. Do not wear jewelry or makeup Do not wear lotions, powders, colognes, or deodorant. Do not shave 48 hours prior to surgery.  Men may shave face and neck. Do not bring valuables to the hospital.  [facility] is not responsible for any belongings or valuables. Do not wear nail polish, gel polish, artificial nails, or any other type of covering on natural nails (fingers and toes) If you have artificial nails or gel coating that need to be removed by a nail salon, please have this removed prior to surgery. Artificial nails or gel coating may interfere with anesthesia's ability to adequately monitor your vital signs.  Wear Clean/Comfortable clothing the morning of surgery Remember to brush your teeth WITH YOUR REGULAR TOOTHPASTE.   Please read over the following fact sheets that you were given.    If you received a COVID test during your pre-op visit  it is requested that you wear a mask when out in public, stay away from anyone that may not be feeling well and notify your surgeon if you develop symptoms. If you have been in contact with anyone that has tested positive in the last 10 days please notify you surgeon.

## 2021-10-24 ENCOUNTER — Encounter (HOSPITAL_COMMUNITY): Payer: Self-pay

## 2021-10-24 ENCOUNTER — Encounter (HOSPITAL_COMMUNITY)
Admission: RE | Admit: 2021-10-24 | Discharge: 2021-10-24 | Disposition: A | Discharge status: Home / Self Care | Payer: BC Managed Care – PPO | Source: Ambulatory Visit | Attending: Neurosurgery | Admitting: Neurosurgery

## 2021-10-24 ENCOUNTER — Other Ambulatory Visit: Payer: Self-pay

## 2021-10-24 VITALS — BP 147/88 | HR 88 | Temp 98.0°F | Resp 17 | Ht 66.0 in | Wt 191.9 lb

## 2021-10-24 DIAGNOSIS — Z01818 Encounter for other preprocedural examination: Secondary | ICD-10-CM

## 2021-10-24 DIAGNOSIS — I10 Essential (primary) hypertension: Secondary | ICD-10-CM | POA: Insufficient documentation

## 2021-10-24 HISTORY — DX: Anxiety disorder, unspecified: F41.9

## 2021-10-24 HISTORY — DX: Depression, unspecified: F32.A

## 2021-10-24 HISTORY — DX: Personal history of urinary calculi: Z87.442

## 2021-10-24 LAB — BASIC METABOLIC PANEL
Anion gap: 8 (ref 5–15)
BUN: 6 mg/dL (ref 6–20)
CO2: 28 mmol/L (ref 22–32)
Calcium: 9.2 mg/dL (ref 8.9–10.3)
Chloride: 105 mmol/L (ref 98–111)
Creatinine, Ser: 0.82 mg/dL (ref 0.61–1.24)
GFR, Estimated: >60 mL/min (ref >60)
Glucose, Bld: 107 mg/dL — ABNORMAL HIGH (ref 70–99)
Potassium: 4.1 mmol/L (ref 3.5–5.1)
Sodium: 141 mmol/L (ref 135–145)

## 2021-10-24 LAB — CBC
HCT: 45.2 % (ref 39.0–52.0)
Hemoglobin: 15.9 g/dL (ref 13.0–17.0)
MCH: 32.9 pg (ref 26.0–34.0)
MCHC: 35.2 g/dL (ref 30.0–36.0)
MCV: 93.4 fL (ref 80.0–100.0)
Platelets: 298 10*3/uL (ref 150–400)
RBC: 4.84 MIL/uL (ref 4.22–5.81)
RDW: 12.6 % (ref 11.5–15.5)
WBC: 8.4 10*3/uL (ref 4.0–10.5)
nRBC: 0 % (ref 0.0–0.2)

## 2021-10-24 LAB — SURGICAL PCR SCREEN
MRSA, PCR: NEGATIVE
Staphylococcus aureus: NEGATIVE

## 2021-10-24 NOTE — Progress Notes (Addendum)
PCP - [place] Pink Cardiologist - Denies  PPM/ICD - Denies  Chest x-ray - 04/29/21 EKG - 10/24/21 Stress Test - Yes cannot remember when, nothing cardiac related ECHO - Denies Cardiac Cath - Denies  Sleep Study - Denies  DM - Denies  COVID TEST- NI  Anesthesia review: Yes abnormal ekg   Patient denies shortness of breath, fever, cough and chest pain at PAT appointment   All instructions explained to the patient, with a verbal understanding of the material. Patient agrees to go over the instructions while at home for a better understanding.  The opportunity to ask questions was provided.

## 2021-10-25 NOTE — Anesthesia Preprocedure Evaluation (Addendum)
Anesthesia Evaluation  Patient identified by MRN, date of birth, ID band Patient awake    Reviewed: Allergy & Precautions, NPO status , Patient's Chart, lab work & pertinent test results  History of Anesthesia Complications Negative for: history of anesthetic complications  Airway Mallampati: III  TM Distance: >3 FB Neck ROM: Full    Dental  (+) Dental Advisory Given, Poor Dentition   Pulmonary former smoker,    Pulmonary exam normal        Cardiovascular hypertension, Pt. on medications Normal cardiovascular exam     Neuro/Psych PSYCHIATRIC DISORDERS Anxiety Depression negative neurological ROS     GI/Hepatic Neg liver ROS, PUD,   Endo/Other   Obesity   Renal/GU      Musculoskeletal negative musculoskeletal ROS (+)   Abdominal   Peds  Hematology negative hematology ROS (+)   Anesthesia Other Findings   Reproductive/Obstetrics                            Anesthesia Physical Anesthesia Plan  ASA: 2  Anesthesia Plan: General   Post-op Pain Management: Tylenol PO (pre-op)*   Induction: Intravenous  PONV Risk Score and Plan: 2 and Treatment may vary due to age or medical condition, Ondansetron, Dexamethasone and Midazolam  Airway Management Planned: Oral ETT  Additional Equipment: None  Intra-op Plan:   Post-operative Plan: Extubation in OR  Informed Consent: I have reviewed the patients History and Physical, chart, labs and discussed the procedure including the risks, benefits and alternatives for the proposed anesthesia with the patient or authorized representative who has indicated his/her understanding and acceptance.     Dental advisory given  Plan Discussed with: CRNA and Anesthesiologist  Anesthesia Plan Comments:       Anesthesia Quick Evaluation

## 2021-10-26 ENCOUNTER — Encounter (HOSPITAL_COMMUNITY)
Admission: RE | Disposition: A | Discharge status: Home / Self Care | Payer: Self-pay | Source: Ambulatory Visit | Attending: Neurosurgery

## 2021-10-26 ENCOUNTER — Ambulatory Visit (HOSPITAL_COMMUNITY): Payer: BC Managed Care – PPO

## 2021-10-26 ENCOUNTER — Encounter (HOSPITAL_COMMUNITY): Payer: Self-pay | Admitting: Neurosurgery

## 2021-10-26 ENCOUNTER — Other Ambulatory Visit: Payer: Self-pay

## 2021-10-26 ENCOUNTER — Ambulatory Visit (HOSPITAL_COMMUNITY): Payer: BC Managed Care – PPO | Admitting: Physician Assistant

## 2021-10-26 ENCOUNTER — Ambulatory Visit (HOSPITAL_COMMUNITY): Payer: BC Managed Care – PPO | Admitting: Anesthesiology

## 2021-10-26 ENCOUNTER — Inpatient Hospital Stay (HOSPITAL_COMMUNITY)
Admission: RE | Admit: 2021-10-26 | Discharge: 2021-10-27 | DRG: 517 | Disposition: A | Discharge status: Home / Self Care | Payer: BC Managed Care – PPO | Source: Ambulatory Visit | Attending: Neurological Surgery | Admitting: Neurological Surgery

## 2021-10-26 DIAGNOSIS — Z87891 Personal history of nicotine dependence: Secondary | ICD-10-CM

## 2021-10-26 DIAGNOSIS — D3617 Benign neoplasm of peripheral nerves and autonomic nervous system of trunk, unspecified: Secondary | ICD-10-CM | POA: Diagnosis present

## 2021-10-26 DIAGNOSIS — D492 Neoplasm of unspecified behavior of bone, soft tissue, and skin: Principal | ICD-10-CM | POA: Diagnosis present

## 2021-10-26 DIAGNOSIS — F32A Depression, unspecified: Secondary | ICD-10-CM | POA: Diagnosis present

## 2021-10-26 DIAGNOSIS — E669 Obesity, unspecified: Secondary | ICD-10-CM | POA: Diagnosis present

## 2021-10-26 DIAGNOSIS — I10 Essential (primary) hypertension: Secondary | ICD-10-CM | POA: Diagnosis present

## 2021-10-26 DIAGNOSIS — Z886 Allergy status to analgesic agent status: Secondary | ICD-10-CM | POA: Diagnosis not present

## 2021-10-26 DIAGNOSIS — Z880 Allergy status to penicillin: Secondary | ICD-10-CM | POA: Diagnosis not present

## 2021-10-26 DIAGNOSIS — Z885 Allergy status to narcotic agent status: Secondary | ICD-10-CM | POA: Diagnosis not present

## 2021-10-26 DIAGNOSIS — Z01818 Encounter for other preprocedural examination: Secondary | ICD-10-CM | POA: Diagnosis not present

## 2021-10-26 DIAGNOSIS — Z87442 Personal history of urinary calculi: Secondary | ICD-10-CM | POA: Diagnosis not present

## 2021-10-26 DIAGNOSIS — F419 Anxiety disorder, unspecified: Secondary | ICD-10-CM | POA: Diagnosis present

## 2021-10-26 DIAGNOSIS — Z79899 Other long term (current) drug therapy: Secondary | ICD-10-CM | POA: Diagnosis not present

## 2021-10-26 DIAGNOSIS — Z6830 Body mass index (BMI) 30.0-30.9, adult: Secondary | ICD-10-CM

## 2021-10-26 HISTORY — PX: LAMINECTOMY: SHX219

## 2021-10-26 SURGERY — LUMBAR LAMINECTOMY FOR TUMOR
Anesthesia: General | Laterality: Right

## 2021-10-26 MED ORDER — ROCURONIUM BROMIDE 10 MG/ML (PF) SYRINGE
PREFILLED_SYRINGE | INTRAVENOUS | Status: DC | PRN
  Administered 2021-10-26: 10 mg via INTRAVENOUS
  Administered 2021-10-26: 60 mg via INTRAVENOUS
  Administered 2021-10-26: 10 mg via INTRAVENOUS

## 2021-10-26 MED ORDER — LACTATED RINGERS IV SOLN: INTRAVENOUS | Status: DC

## 2021-10-26 MED ORDER — CHLORHEXIDINE GLUCONATE 0.12 % MT SOLN
15.0000 mL | Freq: Once | OROMUCOSAL | Status: AC
  Administered 2021-10-26: 15 mL via OROMUCOSAL
  Filled 2021-10-26: qty 15

## 2021-10-26 MED ORDER — OXYCODONE HCL 5 MG/5ML PO SOLN: 5.0000 mg | Freq: Once | ORAL | Status: DC | PRN

## 2021-10-26 MED ORDER — MIDAZOLAM HCL 2 MG/2ML IJ SOLN
INTRAMUSCULAR | Status: DC | PRN
  Administered 2021-10-26: 2 mg via INTRAVENOUS

## 2021-10-26 MED ORDER — ONDANSETRON HCL 4 MG PO TABS: 4.0000 mg | ORAL_TABLET | Freq: Four times a day (QID) | ORAL | Status: DC | PRN

## 2021-10-26 MED ORDER — DEXAMETHASONE SODIUM PHOSPHATE 10 MG/ML IJ SOLN
INTRAMUSCULAR | Status: DC | PRN
  Administered 2021-10-26: 8 mg via INTRAVENOUS

## 2021-10-26 MED ORDER — TRAMADOL HCL 50 MG PO TABS: 50.0000 mg | ORAL_TABLET | Freq: Two times a day (BID) | ORAL | Status: DC | PRN

## 2021-10-26 MED ORDER — CHLORHEXIDINE GLUCONATE CLOTH 2 % EX PADS: 6.0000 | MEDICATED_PAD | Freq: Once | CUTANEOUS | Status: DC

## 2021-10-26 MED ORDER — BACITRACIN ZINC 500 UNIT/GM EX OINT
TOPICAL_OINTMENT | CUTANEOUS | Status: AC
  Filled 2021-10-26: qty 28.35

## 2021-10-26 MED ORDER — KETOROLAC TROMETHAMINE 30 MG/ML IJ SOLN
INTRAMUSCULAR | Status: AC
  Filled 2021-10-26: qty 1

## 2021-10-26 MED ORDER — BUPIVACAINE HCL (PF) 0.25 % IJ SOLN
INTRAMUSCULAR | Status: AC
  Filled 2021-10-26: qty 30

## 2021-10-26 MED ORDER — PROMETHAZINE HCL 25 MG/ML IJ SOLN: 6.2500 mg | INTRAMUSCULAR | Status: DC | PRN

## 2021-10-26 MED ORDER — CYCLOBENZAPRINE HCL 10 MG PO TABS
10.0000 mg | ORAL_TABLET | Freq: Three times a day (TID) | ORAL | Status: DC | PRN
  Administered 2021-10-26 – 2021-10-27 (×2): 10 mg via ORAL
  Filled 2021-10-26 (×2): qty 1

## 2021-10-26 MED ORDER — SODIUM CHLORIDE 0.9% FLUSH: 3.0000 mL | INTRAVENOUS | Status: DC | PRN

## 2021-10-26 MED ORDER — PHENOL 1.4 % MT LIQD: 1.0000 | OROMUCOSAL | Status: DC | PRN

## 2021-10-26 MED ORDER — THROMBIN 5000 UNITS EX SOLR
CUTANEOUS | Status: AC
  Filled 2021-10-26: qty 5000

## 2021-10-26 MED ORDER — FENTANYL CITRATE (PF) 250 MCG/5ML IJ SOLN
INTRAMUSCULAR | Status: DC | PRN
  Administered 2021-10-26: 100 ug via INTRAVENOUS
  Administered 2021-10-26: 50 ug via INTRAVENOUS

## 2021-10-26 MED ORDER — OXYCODONE HCL 5 MG PO TABS: 5.0000 mg | ORAL_TABLET | Freq: Once | ORAL | Status: DC | PRN

## 2021-10-26 MED ORDER — ONDANSETRON HCL 4 MG/2ML IJ SOLN: 4.0000 mg | Freq: Four times a day (QID) | INTRAMUSCULAR | Status: DC | PRN

## 2021-10-26 MED ORDER — KETAMINE HCL 10 MG/ML IJ SOLN
INTRAMUSCULAR | Status: DC | PRN
  Administered 2021-10-26: 30 mg via INTRAVENOUS
  Administered 2021-10-26: 10 mg via INTRAVENOUS

## 2021-10-26 MED ORDER — KETOROLAC TROMETHAMINE 15 MG/ML IJ SOLN
30.0000 mg | Freq: Four times a day (QID) | INTRAMUSCULAR | Status: AC
  Administered 2021-10-26 – 2021-10-27 (×4): 30 mg via INTRAVENOUS
  Filled 2021-10-26 (×4): qty 2

## 2021-10-26 MED ORDER — MIDAZOLAM HCL 2 MG/2ML IJ SOLN
INTRAMUSCULAR | Status: AC
  Filled 2021-10-26: qty 2

## 2021-10-26 MED ORDER — VANCOMYCIN HCL IN DEXTROSE 1-5 GM/200ML-% IV SOLN
1000.0000 mg | INTRAVENOUS | Status: AC
  Administered 2021-10-26: 1000 mg via INTRAVENOUS
  Filled 2021-10-26: qty 200

## 2021-10-26 MED ORDER — LIDOCAINE 2% (20 MG/ML) 5 ML SYRINGE
INTRAMUSCULAR | Status: AC
  Filled 2021-10-26: qty 5

## 2021-10-26 MED ORDER — ACETAMINOPHEN 325 MG PO TABS: 650.0000 mg | ORAL_TABLET | ORAL | Status: DC | PRN

## 2021-10-26 MED ORDER — ONDANSETRON HCL 4 MG/2ML IJ SOLN
INTRAMUSCULAR | Status: AC
  Filled 2021-10-26: qty 2

## 2021-10-26 MED ORDER — ALPRAZOLAM 0.5 MG PO TABS
1.0000 mg | ORAL_TABLET | Freq: Every morning | ORAL | Status: DC
  Administered 2021-10-26: 1 mg via ORAL
  Filled 2021-10-26: qty 2

## 2021-10-26 MED ORDER — LOSARTAN POTASSIUM 50 MG PO TABS
100.0000 mg | ORAL_TABLET | Freq: Every day | ORAL | Status: DC
  Administered 2021-10-26: 100 mg via ORAL
  Filled 2021-10-26: qty 2

## 2021-10-26 MED ORDER — HYDROCODONE-ACETAMINOPHEN 5-325 MG PO TABS: 1.0000 | ORAL_TABLET | ORAL | Status: DC | PRN

## 2021-10-26 MED ORDER — GLYCOPYRROLATE PF 0.2 MG/ML IJ SOSY
PREFILLED_SYRINGE | INTRAMUSCULAR | Status: DC | PRN
  Administered 2021-10-26: .1 mg via INTRAVENOUS

## 2021-10-26 MED ORDER — THROMBIN 20000 UNITS EX SOLR
CUTANEOUS | Status: DC | PRN
  Administered 2021-10-26: 20 mL via TOPICAL

## 2021-10-26 MED ORDER — PROPOFOL 10 MG/ML IV BOLUS
INTRAVENOUS | Status: DC | PRN
  Administered 2021-10-26: 50 mg via INTRAVENOUS
  Administered 2021-10-26: 180 mg via INTRAVENOUS

## 2021-10-26 MED ORDER — GLYCOPYRROLATE PF 0.2 MG/ML IJ SOSY
PREFILLED_SYRINGE | INTRAMUSCULAR | Status: AC
  Filled 2021-10-26: qty 1

## 2021-10-26 MED ORDER — PROPOFOL 10 MG/ML IV BOLUS
INTRAVENOUS | Status: AC
  Filled 2021-10-26: qty 20

## 2021-10-26 MED ORDER — KETAMINE HCL 50 MG/5ML IJ SOSY
PREFILLED_SYRINGE | INTRAMUSCULAR | Status: AC
  Filled 2021-10-26: qty 5

## 2021-10-26 MED ORDER — SODIUM CHLORIDE 0.9 % IV SOLN: 250.0000 mL | INTRAVENOUS | Status: DC

## 2021-10-26 MED ORDER — KETOROLAC TROMETHAMINE 30 MG/ML IJ SOLN
INTRAMUSCULAR | Status: DC | PRN
  Administered 2021-10-26: 30 mg via INTRAVENOUS

## 2021-10-26 MED ORDER — HYDROCODONE-ACETAMINOPHEN 10-325 MG PO TABS
2.0000 | ORAL_TABLET | ORAL | Status: DC | PRN
  Administered 2021-10-26 – 2021-10-27 (×4): 2 via ORAL
  Filled 2021-10-26 (×4): qty 2

## 2021-10-26 MED ORDER — ACETAMINOPHEN 650 MG RE SUPP: 650.0000 mg | RECTAL | Status: DC | PRN

## 2021-10-26 MED ORDER — BUPIVACAINE HCL (PF) 0.25 % IJ SOLN
INTRAMUSCULAR | Status: DC | PRN
  Administered 2021-10-26: 20 mL

## 2021-10-26 MED ORDER — FENTANYL CITRATE (PF) 100 MCG/2ML IJ SOLN
INTRAMUSCULAR | Status: AC
  Filled 2021-10-26: qty 4

## 2021-10-26 MED ORDER — FENTANYL CITRATE (PF) 100 MCG/2ML IJ SOLN
25.0000 ug | INTRAMUSCULAR | Status: DC | PRN
  Administered 2021-10-26 (×3): 50 ug via INTRAVENOUS

## 2021-10-26 MED ORDER — THROMBIN 20000 UNITS EX SOLR
CUTANEOUS | Status: AC
  Filled 2021-10-26: qty 20000

## 2021-10-26 MED ORDER — MICROFIBRILLAR COLL HEMOSTAT EX POWD
CUTANEOUS | Status: AC
  Filled 2021-10-26: qty 5

## 2021-10-26 MED ORDER — VANCOMYCIN HCL IN DEXTROSE 1-5 GM/200ML-% IV SOLN
1000.0000 mg | Freq: Once | INTRAVENOUS | Status: AC
  Administered 2021-10-26: 1000 mg via INTRAVENOUS
  Filled 2021-10-26: qty 200

## 2021-10-26 MED ORDER — CITALOPRAM HYDROBROMIDE 20 MG PO TABS: 20.0000 mg | ORAL_TABLET | Freq: Every day | ORAL | Status: DC

## 2021-10-26 MED ORDER — ALBUTEROL SULFATE (2.5 MG/3ML) 0.083% IN NEBU: 3.0000 mL | INHALATION_SOLUTION | RESPIRATORY_TRACT | Status: DC | PRN

## 2021-10-26 MED ORDER — SODIUM CHLORIDE 0.9% FLUSH
3.0000 mL | Freq: Two times a day (BID) | INTRAVENOUS | Status: DC
  Administered 2021-10-26 – 2021-10-27 (×2): 3 mL via INTRAVENOUS

## 2021-10-26 MED ORDER — SUGAMMADEX SODIUM 200 MG/2ML IV SOLN
INTRAVENOUS | Status: DC | PRN
  Administered 2021-10-26: 200 mg via INTRAVENOUS

## 2021-10-26 MED ORDER — HYDROMORPHONE HCL 1 MG/ML IJ SOLN: 1.0000 mg | INTRAMUSCULAR | Status: DC | PRN

## 2021-10-26 MED ORDER — DEXAMETHASONE SODIUM PHOSPHATE 10 MG/ML IJ SOLN
INTRAMUSCULAR | Status: AC
  Filled 2021-10-26: qty 1

## 2021-10-26 MED ORDER — MENTHOL 3 MG MT LOZG: 1.0000 | LOZENGE | OROMUCOSAL | Status: DC | PRN

## 2021-10-26 MED ORDER — 0.9 % SODIUM CHLORIDE (POUR BTL) OPTIME
TOPICAL | Status: DC | PRN
  Administered 2021-10-26: 1000 mL

## 2021-10-26 MED ORDER — THROMBIN 5000 UNITS EX SOLR
OROMUCOSAL | Status: DC | PRN
  Administered 2021-10-26: 5 mL via TOPICAL

## 2021-10-26 MED ORDER — ACETAMINOPHEN 500 MG PO TABS
1000.0000 mg | ORAL_TABLET | Freq: Once | ORAL | Status: AC
  Administered 2021-10-26: 1000 mg via ORAL
  Filled 2021-10-26: qty 2

## 2021-10-26 MED ORDER — ORAL CARE MOUTH RINSE: 15.0000 mL | Freq: Once | OROMUCOSAL | Status: AC

## 2021-10-26 MED ORDER — LIDOCAINE 2% (20 MG/ML) 5 ML SYRINGE
INTRAMUSCULAR | Status: DC | PRN
  Administered 2021-10-26: 60 mg via INTRAVENOUS

## 2021-10-26 MED ORDER — FENTANYL CITRATE (PF) 250 MCG/5ML IJ SOLN
INTRAMUSCULAR | Status: AC
  Filled 2021-10-26: qty 5

## 2021-10-26 MED ORDER — ONDANSETRON HCL 4 MG/2ML IJ SOLN
INTRAMUSCULAR | Status: DC | PRN
  Administered 2021-10-26: 4 mg via INTRAVENOUS

## 2021-10-26 MED ORDER — ROCURONIUM BROMIDE 10 MG/ML (PF) SYRINGE
PREFILLED_SYRINGE | INTRAVENOUS | Status: AC
  Filled 2021-10-26: qty 10

## 2021-10-26 MED ORDER — PHENYLEPHRINE 80 MCG/ML (10ML) SYRINGE FOR IV PUSH (FOR BLOOD PRESSURE SUPPORT)
PREFILLED_SYRINGE | INTRAVENOUS | Status: DC | PRN
  Administered 2021-10-26 (×5): 80 ug via INTRAVENOUS

## 2021-10-26 MED ORDER — TURMERIC 500 MG PO CAPS: 500.0000 mg | ORAL_CAPSULE | Freq: Every day | ORAL | Status: DC

## 2021-10-26 SURGICAL SUPPLY — 60 items
BAG COUNTER SPONGE SURGICOUNT (BAG) ×1 IMPLANT
BAND RUBBER #18 3X1/16 STRL (MISCELLANEOUS) ×2 IMPLANT
BENZOIN TINCTURE PRP APPL 2/3 (GAUZE/BANDAGES/DRESSINGS) ×1 IMPLANT
BLADE SURG 11 STRL SS (BLADE) IMPLANT
BUR CUTTER 7.0 ROUND (BURR) IMPLANT
BUR MATCHSTICK NEURO 3.0 LAGG (BURR) IMPLANT
CANISTER SUCT 3000ML PPV (MISCELLANEOUS) ×1 IMPLANT
CARTRIDGE OIL MAESTRO DRILL (MISCELLANEOUS) ×1 IMPLANT
CLOSURE STERI STRIP 1/2 X4 (GAUZE/BANDAGES/DRESSINGS) IMPLANT
DERMABOND ADVANCED .7 DNX12 (GAUZE/BANDAGES/DRESSINGS) ×1 IMPLANT
DIFFUSER DRILL AIR PNEUMATIC (MISCELLANEOUS) ×1 IMPLANT
DRAPE LAPAROTOMY 100X72X124 (DRAPES) ×1 IMPLANT
DRAPE LAPAROTOMY T 102X78X121 (DRAPES) IMPLANT
DRAPE MICROSCOPE SLANT 54X150 (MISCELLANEOUS) ×1 IMPLANT
DRAPE SURG 17X23 STRL (DRAPES) ×2 IMPLANT
DRSG OPSITE POSTOP 3X4 (GAUZE/BANDAGES/DRESSINGS) IMPLANT
DURAPREP 26ML APPLICATOR (WOUND CARE) ×1 IMPLANT
ELECT REM PT RETURN 9FT ADLT (ELECTROSURGICAL) ×1
ELECTRODE REM PT RTRN 9FT ADLT (ELECTROSURGICAL) ×1 IMPLANT
GAUZE 4X4 16PLY ~~LOC~~+RFID DBL (SPONGE) IMPLANT
GAUZE SPONGE 4X4 12PLY STRL (GAUZE/BANDAGES/DRESSINGS) ×1 IMPLANT
GLOVE BIO SURGEON STRL SZ 6.5 (GLOVE) ×1 IMPLANT
GLOVE BIOGEL PI IND STRL 6.5 (GLOVE) ×1 IMPLANT
GLOVE ECLIPSE 9.0 STRL (GLOVE) ×1 IMPLANT
GOWN STRL REUS W/ TWL XL LVL3 (GOWN DISPOSABLE) IMPLANT
GOWN STRL REUS W/TWL XL LVL3 (GOWN DISPOSABLE) ×1
HEMOSTAT POWDER SURGIFOAM 1G (HEMOSTASIS) IMPLANT
HEMOSTAT SURGICEL 2X14 (HEMOSTASIS) IMPLANT
KIT BASIN OR (CUSTOM PROCEDURE TRAY) ×1 IMPLANT
KIT TURNOVER KIT B (KITS) ×1 IMPLANT
NDL HYPO 25X1 1.5 SAFETY (NEEDLE) ×1 IMPLANT
NDL SPNL 20GX3.5 QUINCKE YW (NEEDLE) IMPLANT
NEEDLE HYPO 22GX1.5 SAFETY (NEEDLE) ×1 IMPLANT
NEEDLE HYPO 25X1 1.5 SAFETY (NEEDLE) IMPLANT
NEEDLE SPNL 20GX3.5 QUINCKE YW (NEEDLE) ×1 IMPLANT
NS IRRIG 1000ML POUR BTL (IV SOLUTION) ×1 IMPLANT
OIL CARTRIDGE MAESTRO DRILL (MISCELLANEOUS) ×1
PACK LAMINECTOMY NEURO (CUSTOM PROCEDURE TRAY) ×1 IMPLANT
PATTIES SURGICAL .5 X.5 (GAUZE/BANDAGES/DRESSINGS) IMPLANT
PATTIES SURGICAL .5 X3 (DISPOSABLE) IMPLANT
PATTIES SURGICAL 1X1 (DISPOSABLE) IMPLANT
PENCIL BUTTON HOLSTER BLD 10FT (ELECTRODE) IMPLANT
SPONGE SURGIFOAM ABS GEL 100 (HEMOSTASIS) IMPLANT
SPONGE T-LAP 4X18 ~~LOC~~+RFID (SPONGE) IMPLANT
STAPLER VISISTAT 35W (STAPLE) ×1 IMPLANT
STRIP CLOSURE SKIN 1/2X4 (GAUZE/BANDAGES/DRESSINGS) ×1 IMPLANT
SUT ETHILON 4 0 PS 2 18 (SUTURE) ×1 IMPLANT
SUT NURALON 4 0 TR CR/8 (SUTURE) IMPLANT
SUT PROLENE 5 0 C1 (SUTURE) IMPLANT
SUT PROLENE 6 0 BV (SUTURE) IMPLANT
SUT VIC AB 0 CT1 18XCR BRD8 (SUTURE) IMPLANT
SUT VIC AB 0 CT1 8-18 (SUTURE) ×1
SUT VIC AB 2-0 CT1 18 (SUTURE) ×1 IMPLANT
SUT VIC AB 3-0 SH 8-18 (SUTURE) IMPLANT
SUT VICRYL 4-0 PS2 18IN ABS (SUTURE) IMPLANT
TOWEL GREEN STERILE (TOWEL DISPOSABLE) ×1 IMPLANT
TOWEL GREEN STERILE FF (TOWEL DISPOSABLE) ×1 IMPLANT
TRAY FOLEY MTR SLVR 16FR STAT (SET/KITS/TRAYS/PACK) IMPLANT
TUBE CONNECTING 12X1/4 (SUCTIONS) IMPLANT
WATER STERILE IRR 1000ML POUR (IV SOLUTION) ×1 IMPLANT

## 2021-10-26 NOTE — Progress Notes (Signed)
Pt oob to BR x 3 to void, pt report some burning sensation with voiding d/t removal of foley after surgery. Pt ambulated 500 ft in hallway with steady assist by RN and spouse. Pt able to move, bend and kick RLE but the leg tends to buckle at times when walking. Pt back to room sitting up in bed with spouse at bedside. Will continue to closely monitor. Delia Heady RN

## 2021-10-26 NOTE — Progress Notes (Signed)
PHARMACIST - PHYSICIAN ORDER COMMUNICATION  CONCERNING: P&T Medication Policy on Herbal Medications  DESCRIPTION:  This patient's order for:  tumeric  has been noted.  This product(s) is classified as an "herbal" or natural product. Due to a lack of definitive safety studies or FDA approval, nonstandard manufacturing practices, plus the potential risk of unknown drug-drug interactions while on inpatient medications, the Pharmacy and Therapeutics Committee does not permit the use of "herbal" or natural products of this type within [facility].   ACTION TAKEN: The pharmacy department is unable to verify this order at this time and your patient has been informed of this safety policy. Please reevaluate patient's clinical condition at discharge and address if the herbal or natural product(s) should be resumed at that time.    Wanona Stare A. Levada Dy, PharmD, BCPS, FNKF Clinical Pharmacist [place] Please utilize Amion for appropriate phone number to reach the unit pharmacist ([place])

## 2021-10-26 NOTE — Progress Notes (Signed)
Pharmacy Antibiotic Note  TRAPPER MEECH is a 51 y.o. male admitted on 10/26/2021 with surgical prophylaxis.  Pharmacy has been consulted for vancomycin dosing.  PCN allergy with hives, swelling. No hx receiving antibiotics here. Received vancomycin 1g at 7am. Clcr > 100 ml/min. Confirmed no drain per RN.   Plan: Vancomycin 1g x1   Height: '5\' 6"'$  (167.6 cm) Weight: 86.2 kg (190 lb) IBW/kg (Calculated) : 63.8  Temp (24hrs), Avg:98.3 F (36.8 C), Min:97.8 F (36.6 C), Max:98.7 F (37.1 C)  Recent Labs  Lab 10/24/21 0900  WBC 8.4  CREATININE 0.82    Estimated Creatinine Clearance: 109.7 mL/min (by C-G formula based on SCr of 0.82 mg/dL).    Allergies  Allergen Reactions   Amoxicillin Shortness Of Breath and Swelling    Passes out   Morphine Nausea And Vomiting   Aspirin Other (See Comments)    Causes GI bleeding   Penicillins Hives and Swelling      Thank you for allowing pharmacy to be a part of this patient's care.  Benetta Spar, PharmD, BCPS, BCCP Clinical Pharmacist  Please check AMION for all [place] phone numbers After 10:00 PM, call [place] [phone]

## 2021-10-26 NOTE — Anesthesia Postprocedure Evaluation (Signed)
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  Last Vitals:  Vitals:   10/26/21 1115 10/26/21 1130  BP: (!) 150/98 (!) 132/90  Pulse: 79 74  Resp: 17 11  Temp:    SpO2: 95% 93%    Last Pain:  Vitals:   10/26/21 1130  TempSrc:   PainSc: Asleep    LLE Motor Response: Purposeful movement;Responds to commands (10/26/21 1130) LLE Sensation: Full sensation (10/26/21 1130) RLE Motor Response: Purposeful movement;Responds to commands (10/26/21 1130) RLE Sensation: Full sensation (10/26/21 1130)      Audry Pili

## 2021-10-26 NOTE — H&P (Signed)
Daniel Harris is an 51 y.o. male.   Chief Complaint: Back pain HPI: 51 year old male with progressively worsening back pain with right lower extremity pain numbness and weakness consistent with a right-sided L3 radicular pattern.  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  Patient presents now for resection/debulking of this tumor in hopes of improving his symptoms.  Past Medical History:  Diagnosis Date   Anxiety    Bleeding gastric ulcer    Depression    History of kidney stones    Hypertension     Past Surgical History:  Procedure Laterality Date   ESOPHAGOGASTRODUODENOSCOPY      History reviewed. No pertinent family history. Social History:  reports that he quit smoking about 5 years ago. His smoking use included cigarettes. He has never used smokeless tobacco. He reports that he does not drink alcohol and does not use drugs.  Allergies:  Allergies  Allergen Reactions   Amoxicillin Shortness Of Breath and Swelling    Passes out   Morphine Nausea And Vomiting   Aspirin Other (See Comments)    Causes GI bleeding   Penicillins Hives and Swelling    Medications Prior to Admission  Medication Sig Dispense Refill   ALPRAZolam (XANAX XR) 1 MG 24 hr tablet Take 1 mg by mouth every morning.     aspirin-acetaminophen-caffeine (EXCEDRIN MIGRAINE) 250-250-65 MG tablet Take 2 tablets by mouth every morning.     citalopram (CELEXA) 20 MG tablet Take 20 mg by mouth daily.     losartan (COZAAR) 100 MG tablet Take 100 mg by mouth daily.     methocarbamol (ROBAXIN) 500 MG tablet Take 1 tablet (500 mg total) by mouth 4 (four) times daily. (Patient taking differently: Take 500 mg by mouth every 8 (eight) hours as needed for muscle spasms.) 28 tablet 0   traMADol (ULTRAM) 50 MG tablet Take 50 mg by mouth 2 (two) times daily as needed for pain.      trolamine salicylate (ASPERCREME) 10 % cream Apply 1 Application topically as needed for muscle pain.     Turmeric 500 MG CAPS Take 500 mg by mouth daily.     albuterol (VENTOLIN HFA) 108 (90 Base) MCG/ACT inhaler Inhale 2 puffs into the lungs every 4 (four) hours as needed for wheezing or shortness of breath. 6.7 g 0    Results for orders placed or performed during the hospital encounter of 10/24/21 (from the past 48 hour(s))  Basic metabolic panel per protocol     Status: Abnormal   Collection Time: 10/24/21  9:00 AM  Result Value Ref Range   Sodium 141 135 - 145 mmol/L   Potassium 4.1 3.5 - 5.1 mmol/L   Chloride 105 98 - 111 mmol/L   CO2 28 22 - 32 mmol/L   Glucose, Bld 107 (H) 70 - 99 mg/dL    Comment: Glucose reference range applies only to samples taken after fasting for at least 8 hours.   BUN 6 6 - 20 mg/dL   Creatinine, Ser 0.82 0.61 - 1.24 mg/dL   Calcium 9.2 8.9 - 10.3 mg/dL   GFR, Estimated >60 >60 mL/min    Comment: (NOTE) Calculated using the CKD-EPI Creatinine Equation (2021)    Anion gap 8 5 - 15    Comment: Performed at [facility] [address] [place], [place] [address]  CBC per  protocol     Status: None   Collection Time: 10/24/21  9:00 AM  Result Value Ref Range   WBC 8.4 4.0 - 10.5 K/uL   RBC 4.84 4.22 - 5.81 MIL/uL   Hemoglobin 15.9 13.0 - 17.0 g/dL   HCT 45.2 39.0 - 52.0 %   MCV 93.4 80.0 - 100.0 fL   MCH 32.9 26.0 - 34.0 pg   MCHC 35.2 30.0 - 36.0 g/dL   RDW 12.6 11.5 - 15.5 %   Platelets 298 150 - 400 K/uL   nRBC 0.0 0.0 - 0.2 %    Comment: Performed at [facility], [place] [address], [place], [place] [address]  Surgical pcr screen     Status: None   Collection Time: 10/24/21  9:16 AM   Specimen: Nasal Mucosa; Nasal Swab  Result Value Ref Range   MRSA, PCR NEGATIVE NEGATIVE   Staphylococcus aureus NEGATIVE NEGATIVE    Comment: (NOTE) The Xpert SA Assay (FDA approved for NASAL specimens in patients 13 years of age and older), is one  component of a comprehensive surveillance program. It is not intended to diagnose infection nor to guide or monitor treatment. Performed at [facility], [place] [address], [place], [place] [address]    No results found.  Pertinent items noted in HPI and remainder of comprehensive ROS otherwise negative.  Pulse 87, temperature 97.9 F (36.6 C), temperature source Oral, resp. rate 18, height '5\' 6"'$  (1.676 m), weight 86.2 kg, SpO2 95 %.  Patient is awake and alert.  He is oriented and appropriate.  His speech is fluent.  His judgment insight are intact.  Cranial nerve function normal bilateral.  Motor examination reveals weakness in his right quadriceps grading out of 4-/5 with some wasting.  Sensory examination with decrease sensation to very light touch in his right L3 dermatome.  Deep Temrex is normal active symptoms right patellar reflexes diminished.  No evidence of long track signs.  Examination head ears eyes nose throat some are clear chest and abdomen are benign.  Extremities are free from injury or deformity. 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  Plan right L3-4 extraforaminal approach with resection of tumor.  Risks and benefits been explained.  Patient wishes to proceed.  Daniel Harris 10/26/2021, 7:49 AM

## 2021-10-26 NOTE — Transfer of Care (Signed)
Immediate Anesthesia Transfer of Care Note  Patient: Daniel Harris  Procedure(s) Performed: Right - Lumbar three-Lumbar four resection of nerve sheath tumor (Right)  Patient Location: PACU  Anesthesia Type:General  Level of Consciousness: drowsy  Airway & Oxygen Therapy: Patient Spontanous Breathing and Patient connected to nasal cannula oxygen  Post-op Assessment: Report given to RN and Post -op Vital signs reviewed and stable  Post vital signs: Reviewed and stable  Last Vitals:  Vitals Value Taken Time  BP 127/87 10/26/21 1038  Temp    Pulse 82 10/26/21 1042  Resp 12 10/26/21 1042  SpO2 91 % 10/26/21 1042  Vitals shown include unvalidated device data.  Last Pain:  Vitals:   10/26/21 0654  TempSrc:   PainSc: 6          Complications: No notable events documented.

## 2021-10-26 NOTE — Op Note (Signed)
Date of procedure: 10/26/2021  Date of dictation: Same  Service: Neurosurgery  Preoperative diagnosis: Right L3 nerve sheath tumor  Postoperative diagnosis: Same  Procedure Name: Right L3-4 extraforaminal decompression with resection of intradural extramedullary tumor, microdissection  Surgeon:Gopal Malter A.Chidera Dearcos, M.D.  Asst. Surgeon: Reinaldo Meeker, NP  Anesthesia: General  Indication: 51 year old male with a symptomatic and enlarging right-sided L3 nerve sheath tumor extending into the foramen and abutting the spinal canal and extending far out into the psoas muscle and retroperitoneal space.  Patient has ongoing pain and worsening weakness and sensory loss.  He presents now for resection of this enlarging mass.  Operative note: After induction of anesthesia, patient position prone onto Wilson frame and properly padded.  Lumbar region prepped and draped sterilely.  Incision made overlying L3-4.  Dissection performed on the right.  Retractor placed.  X-ray taken.  Level confirmed.  Extraforaminal approach was then performed using high-speed drill and Kerrison rongeurs to remove the superior aspect of the superior articulating process of L4 and the lateral aspect of the pars interarticularis.  Intertransverse ligament was elevated and resected.  Thecal sac overlying the L3 nerve root was identified and obviously grossly enlarged by tumor.  The dissection was carried toward the spinal canal to where the tumor seem to and.  The dura overlying the nerve root sheath was incised.  Tumor was encountered.  Using microscope for microdissection of the extraforaminal space the tumor was gradually debulked.  The normal L3 nerve root was eventually identified and protected.  The tumor was guided and then the lateral aspects were delivered into the central cavity where they were removed using pituitary rongeurs.  Eventually all tumor was resected.  The nerve itself was somewhat macerated but appeared intact.  There was no  evidence of CSF leak.  The wound was irrigated.  Gelfoam was placed topically for hemostasis.  Specimen was sent to pathology.  Gelfoam was placed over the dural opening.  The wound was then closed in layers with Vicryl sutures.  Steri-Strips and sterile dressing were applied.  No apparent complications.  The patient tolerated the procedure well and he returns to the recovery room postoperative

## 2021-10-26 NOTE — Brief Op Note (Signed)
10/26/2021  10:25 AM  PATIENT:  Thomasene Mohair  51 y.o. male  PRE-OPERATIVE DIAGNOSIS:  Nerve sheath tumor  POST-OPERATIVE DIAGNOSIS:  Nerve sheath tumor  PROCEDURE:  Procedure(s): Right - Lumbar three-Lumbar four resection of nerve sheath tumor (Right)  SURGEON:  Surgeon(s) and Role:    Earnie Larsson, MD - Primary  PHYSICIAN ASSISTANT:   ASSISTANTSMearl Latin   ANESTHESIA:   general  EBL:  250 mL   BLOOD ADMINISTERED:none  DRAINS: none   LOCAL MEDICATIONS USED:  MARCAINE     SPECIMEN:  Source of Specimen:  R L3 Nerve root tumor  DISPOSITION OF SPECIMEN:  PATHOLOGY  COUNTS:  YES  TOURNIQUET:  * No tourniquets in log *  DICTATION: .Dragon Dictation  PLAN OF CARE: Admit for overnight observation  PATIENT DISPOSITION:  PACU - hemodynamically stable.   Delay start of Pharmacological VTE agent (>24hrs) due to surgical blood loss or risk of bleeding: yes

## 2021-10-26 NOTE — Anesthesia Procedure Notes (Signed)
Procedure Name: Intubation Date/Time: 10/26/2021 8:07 AM  Performed by: Erick Colace, CRNAPre-anesthesia Checklist: Patient identified, Emergency Drugs available, Suction available and Patient being monitored Patient Re-evaluated:Patient Re-evaluated prior to induction Oxygen Delivery Method: Circle system utilized Preoxygenation: Pre-oxygenation with 100% oxygen Induction Type: IV induction Ventilation: Mask ventilation without difficulty and Oral airway inserted - appropriate to patient size Laryngoscope Size: Mac and 4 Grade View: Grade I Tube type: Oral Tube size: 7.5 mm Number of attempts: 1 Airway Equipment and Method: Stylet and Oral airway Placement Confirmation: ETT inserted through vocal cords under direct vision, positive ETCO2 and breath sounds checked- equal and bilateral Secured at: 22 cm Tube secured with: Tape Dental Injury: Teeth and Oropharynx as per pre-operative assessment

## 2021-10-27 ENCOUNTER — Encounter (HOSPITAL_COMMUNITY): Payer: Self-pay | Admitting: Neurosurgery

## 2021-10-27 MED ORDER — HYDROCODONE-ACETAMINOPHEN 5-325 MG PO TABS: 1.0000 | ORAL_TABLET | ORAL | 0 refills | Status: AC | PRN

## 2021-10-27 MED ORDER — CYCLOBENZAPRINE HCL 10 MG PO TABS: 10.0000 mg | ORAL_TABLET | Freq: Three times a day (TID) | ORAL | 0 refills | Status: AC | PRN

## 2021-10-27 NOTE — Progress Notes (Signed)
Patient is discharged from room 3C09 at this time. Alert and in stable condition. IV site d/c'd and instructions read to patient and spouse with understanding verbalized and all questions answered. Left unit via wheelchair with all belongings at side. Marland Kitchen

## 2021-10-27 NOTE — Discharge Summary (Signed)
Physician Discharge Summary  Patient ID: Daniel Harris MRN: [id number] DOB/AGE: 51-07-1970 51 y.o.  Admit date: 10/26/2021 Discharge date: 10/27/2021  Admission Diagnoses:  Right L3 nerve sheath tumor     Discharge Diagnoses: same   Discharged Condition: good  Hospital Course: The patient was admitted on 10/26/2021 and taken to the operating room where the patient underwent Right L3-4 lami. The patient tolerated the procedure well and was taken to the recovery room and then to the floor in stable condition. The hospital course was routine. There were no complications. The wound remained clean dry and intact. Pt had appropriate back soreness. No complaints of leg pain or new N/T/W. The patient remained afebrile with stable vital signs, and tolerated a regular diet. The patient continued to increase activities, and pain was well controlled with oral pain medications.   Consults: None  Significant Diagnostic Studies:  Results for orders placed or performed during the hospital encounter of 10/24/21  Surgical pcr screen   Specimen: Nasal Mucosa; Nasal Swab  Result Value Ref Range   MRSA, PCR NEGATIVE NEGATIVE   Staphylococcus aureus NEGATIVE NEGATIVE  Basic metabolic panel per protocol  Result Value Ref Range   Sodium 141 135 - 145 mmol/L   Potassium 4.1 3.5 - 5.1 mmol/L   Chloride 105 98 - 111 mmol/L   CO2 28 22 - 32 mmol/L   Glucose, Bld 107 (H) 70 - 99 mg/dL   BUN 6 6 - 20 mg/dL   Creatinine, Ser 0.82 0.61 - 1.24 mg/dL   Calcium 9.2 8.9 - 10.3 mg/dL   GFR, Estimated >60 >60 mL/min   Anion gap 8 5 - 15  CBC per protocol  Result Value Ref Range   WBC 8.4 4.0 - 10.5 K/uL   RBC 4.84 4.22 - 5.81 MIL/uL   Hemoglobin 15.9 13.0 - 17.0 g/dL   HCT 45.2 39.0 - 52.0 %   MCV 93.4 80.0 - 100.0 fL   MCH 32.9 26.0 - 34.0 pg   MCHC 35.2 30.0 - 36.0 g/dL   RDW 12.6 11.5 - 15.5 %   Platelets 298 150 - 400 K/uL   nRBC 0.0 0.0 - 0.2 %    DG Lumbar Spine 2-3 Views  Result Date:  10/26/2021 CLINICAL DATA:  Lumbar surgery for tumor resection. EXAM: LUMBAR SPINE - 2-3 VIEW COMPARISON:  Lumbar spine MRI 09/12/2021 FINDINGS: Two portable cross-table lateral radiographs of the lumbar spine are provided. On the first, the tip of a needle projects over the L3 spinous process. On the second, the tip of a surgical instrument projects over the L3-4 neural foramina. IMPRESSION: Intraoperative localization as above. Electronically Signed   By: Logan Bores M.D.   On: 10/26/2021 11:06    Antibiotics:  Anti-infectives (From admission, onward)    Start     Dose/Rate Route Frequency Ordered Stop   10/26/21 1900  vancomycin (VANCOCIN) IVPB 1000 mg/200 mL premix        1,000 mg 200 mL/hr over 60 Minutes Intravenous  Once 10/26/21 1328 10/26/21 2026   10/26/21 0645  vancomycin (VANCOCIN) IVPB 1000 mg/200 mL premix        1,000 mg 200 mL/hr over 60 Minutes Intravenous On call to O.R. 10/26/21 0634 10/26/21 1213       Discharge Exam: Blood pressure 134/80, pulse 75, temperature 98.2 F (36.8 C), temperature source Oral, resp. rate 18, height '5\' 6"'$  (1.676 m), weight 86.2 kg, SpO2 95 %. Neurologic: Grossly normal Ambulating and voiding well incision  cdi   Discharge Medications:   Allergies as of 10/27/2021       Reactions   Amoxicillin Shortness Of Breath, Swelling   Passes out   Morphine Nausea And Vomiting   Aspirin Other (See Comments)   Causes GI bleeding   Penicillins Hives, Swelling        Medication List     STOP taking these medications    methocarbamol 500 MG tablet Commonly known as: Robaxin       TAKE these medications    albuterol 108 (90 Base) MCG/ACT inhaler Commonly known as: VENTOLIN HFA Inhale 2 puffs into the lungs every 4 (four) hours as needed for wheezing or shortness of breath.   ALPRAZolam 1 MG 24 hr tablet Commonly known as: XANAX XR Take 1 mg by mouth every morning.   aspirin-acetaminophen-caffeine 250-250-65 MG tablet Commonly known  as: EXCEDRIN MIGRAINE Take 2 tablets by mouth every morning.   citalopram 20 MG tablet Commonly known as: CELEXA Take 20 mg by mouth daily.   cyclobenzaprine 10 MG tablet Commonly known as: FLEXERIL Take 1 tablet (10 mg total) by mouth 3 (three) times daily as needed for muscle spasms.   HYDROcodone-acetaminophen 5-325 MG tablet Commonly known as: NORCO/VICODIN Take 1 tablet by mouth every 4 (four) hours as needed for moderate pain.   losartan 100 MG tablet Commonly known as: COZAAR Take 100 mg by mouth daily.   traMADol 50 MG tablet Commonly known as: ULTRAM Take 50 mg by mouth 2 (two) times daily as needed for pain.   trolamine salicylate 10 % cream Commonly known as: ASPERCREME Apply 1 Application topically as needed for muscle pain.   Turmeric 500 MG Caps Take 500 mg by mouth daily.        Disposition: home   Final Dx: Right L3-4 lami  Discharge Instructions      Remove dressing in 72 hours   Complete by: As directed    Call MD for:  difficulty breathing, headache or visual disturbances   Complete by: As directed    Call MD for:  hives   Complete by: As directed    Call MD for:  persistant nausea and vomiting   Complete by: As directed    Call MD for:  redness, tenderness, or signs of infection (pain, swelling, redness, odor or green/yellow discharge around incision site)   Complete by: As directed    Call MD for:  severe uncontrolled pain   Complete by: As directed    Call MD for:  temperature >100.4   Complete by: As directed    Diet - low sodium heart healthy   Complete by: As directed    Driving Restrictions   Complete by: As directed    No driving for 2 weeks, no riding in the car for 1 week   Increase activity slowly   Complete by: As directed    Lifting restrictions   Complete by: As directed    No lifting more than 8 lbs          Signed: Ocie Cornfield Annora Guderian 10/27/2021, 7:15 AM

## 2021-10-27 NOTE — Evaluation (Signed)
Physical Therapy Evaluation and Discharge Patient Details Name: Daniel Harris MRN: [id number] DOB: 12-31-1970 Today's Date: 10/27/2021  History of Present Illness  Pt is 51 yo male who underwent R L3-4 lami for L3 nerve sheath tumor on 10/26/21. PMH including anxiety, depression, and HTN.  Clinical Impression  Patient evaluated by Physical Therapy with no further acute PT needs identified. All education has been completed and the patient has no further questions. Pt received in room with wife. Education given on precautions, posture, activity level upon return home and activities to avoid in home environment. Pt ambulated 300' with notable favoring of RLE but no LOB. Pt able to ascend and descend a flight of stairs with use of rail.  No current PT needs but may need outpt follow up in 6-8 wks for RLE strengthening before returning to work. See below for any follow-up Physical Therapy or equipment needs. PT is signing off. Thank you for this referral.        Recommendations for follow up therapy are one component of a multi-disciplinary discharge planning process, led by the attending physician.  Recommendations may be updated based on patient status, additional functional criteria and insurance authorization.  Follow Up Recommendations No PT follow up (initially, potentially outpt later for RLE)      Assistance Recommended at Discharge None  Patient can return home with the following  Assistance with cooking/housework    Equipment Recommendations None recommended by PT  Recommendations for Other Services       Functional Status Assessment Patient has had a recent decline in their functional status and demonstrates the ability to make significant improvements in function in a reasonable and predictable amount of time.     Precautions / Restrictions Precautions Precautions: Back Precaution Booklet Issued: Yes (comment) Precaution Comments: reviewed posture, precautions, and  activities to avoid at home Required Braces or Orthoses: Other Brace Other Brace: no brace Restrictions Weight Bearing Restrictions: No      Mobility  Bed Mobility Overal bed mobility: Modified Independent             General bed mobility comments: pt able to move from supine to sit and sit to supine without assist, cues for precautions    Transfers Overall transfer level: Modified independent Equipment used: None                    Ambulation/Gait Ambulation/Gait assistance: Modified independent (Device/Increase time) Gait Distance (Feet): 300 Feet Assistive device: None Gait Pattern/deviations: Antalgic, Decreased weight shift to right Gait velocity: slightly decreased Gait velocity interpretation: >2.62 ft/sec, indicative of community ambulatory   General Gait Details: pt c/o mild dizziness initially but improved with distance. R knee mildly giving way at times but pt compensating with L side  Stairs Stairs: Yes Stairs assistance: Supervision Stair Management: One rail Right, Step to pattern, Forwards Number of Stairs: 10 General stair comments: pt began with alternating pattern but clearly painful when ascending with RLE leading, cues for LLE ascent and RLE descent for pain control in acute phase  Wheelchair Mobility    Modified Rankin (Stroke Patients Only)       Balance Overall balance assessment: Mild deficits observed, not formally tested                                           Pertinent Vitals/Pain Pain Assessment Pain Assessment: 0-10 Pain  Score: 7  Pain Location: Back Pain Descriptors / Indicators: Discomfort, Grimacing Pain Intervention(s): Limited activity within patient's tolerance, Monitored during session, Premedicated before session    [place] expects to be discharged to:: Private residence Living Arrangements: Spouse/significant other;Children;Non-relatives/Friends Available Help at  Discharge: Family Type of Home: House Home Access: Stairs to enter Entrance Stairs-Rails: Chemical engineer of Steps: 6   Home Layout: One level Home Equipment: None Additional Comments: pt lives with his wife, he works as a Advice worker Prior Level of Function : Independent/Modified Independent;Driving;Working/employed                     Hand Dominance   Dominant Hand: Right    Extremity/Trunk Assessment   Upper Extremity Assessment Upper Extremity Assessment: Defer to OT evaluation    Lower Extremity Assessment Lower Extremity Assessment: RLE deficits/detail RLE Deficits / Details: knee ext 4/5 compared to 5/5 on L RLE Sensation: WNL RLE Coordination: WNL    Cervical / Trunk Assessment Cervical / Trunk Assessment: Back Surgery  Communication   Communication: No difficulties  Cognition Arousal/Alertness: Awake/alert Behavior During Therapy: WFL for tasks assessed/performed Overall Cognitive Status: Impaired/Different from baseline Area of Impairment: Problem solving                             Problem Solving: Slow processing, Requires verbal cues General Comments: Slightly tangential. Requiring increased time and cues to attend to instructions        General Comments General comments (skin integrity, edema, etc.): wife and pt educated on activity level for return home. Pt also found in doorway later with poor posture, further education given on precautions    Exercises     Assessment/Plan    PT Assessment All further PT needs can be met in the next venue of care (potential outpt PT for RLE strengthening after acute phase of healing)  PT Problem List Decreased strength;Decreased activity tolerance;Decreased mobility;Decreased knowledge of precautions;Pain       PT Treatment Interventions      PT Goals (Current goals can be found in the Care Plan section)  Acute Rehab PT Goals Patient Stated Goal: return  home PT Goal Formulation: All assessment and education complete, DC therapy    Frequency       Co-evaluation               AM-PAC PT "6 Clicks" Mobility  Outcome Measure Help needed turning from your back to your side while in a flat bed without using bedrails?: None Help needed moving from lying on your back to sitting on the side of a flat bed without using bedrails?: None Help needed moving to and from a bed to a chair (including a wheelchair)?: None Help needed standing up from a chair using your arms (e.g., wheelchair or bedside chair)?: None Help needed to walk in hospital room?: None Help needed climbing 3-5 steps with a railing? : A Little 6 Click Score: 23    End of Session   Activity Tolerance: Patient tolerated treatment well Patient left: in bed;with call bell/phone within reach;with family/visitor present Nurse Communication: Mobility status PT Visit Diagnosis: Pain;Difficulty in walking, not elsewhere classified (R26.2) Pain - Right/Left: Right Pain - part of body:  (back)    Time: 6384-5364 PT Time Calculation (min) (ACUTE ONLY): 24 min   Charges:   PT Evaluation $PT Eval Low Complexity: 1 Low PT Treatments $  Gait Training: 8-22 mins        Leighton Roach, PT  Acute Rehab Services Secure chat preferred Office [place] 10/27/2021, 9:08 AM

## 2021-10-27 NOTE — Evaluation (Signed)
Occupational Therapy Evaluation Patient Details Name: Daniel Harris MRN: [id number] DOB: 02-22-1970 Today's Date: 10/27/2021   History of Present Illness Pt is 51 yo male who underwent R L3-4 lami for L3 nerve sheath tumor on 10/26/21. PMH including anxiety, depression, and HTN.   Clinical Impression   PTA, pt was living with his wife and son and was independent; works as a Dealer for school buses. Currently, pt performing at Mod I level for ADLs and functional mobility. Provided education and handout on back precautions, grooming, LB ADLs, toileting, and shower transfer; pt demonstrated understanding. Answered all pt questions. Recommend dc home once medically stable per physician. All acute OT needs met and will sign off. Thank you.    Recommendations for follow up therapy are one component of a multi-disciplinary discharge planning process, led by the attending physician.  Recommendations may be updated based on patient status, additional functional criteria and insurance authorization.   Follow Up Recommendations  No OT follow up    Assistance Recommended at Discharge PRN  Patient can return home with the following      Functional Status Assessment  Patient has had a recent decline in their functional status and demonstrates the ability to make significant improvements in function in a reasonable and predictable amount of time.  Equipment Recommendations  None recommended by OT    Recommendations for Other Services       Precautions / Restrictions Precautions Precautions: Back Precaution Booklet Issued: Yes (comment) Required Braces or Orthoses: Other Brace Other Brace: no brace Restrictions Weight Bearing Restrictions: No      Mobility Bed Mobility               General bed mobility comments: In bathroom upon arrival    Transfers Overall transfer level: Modified independent                 General transfer comment: Increased time      Balance  Overall balance assessment: Mild deficits observed, not formally tested                                         ADL either performed or assessed with clinical judgement   ADL Overall ADL's : Modified independent                                       General ADL Comments: Reviewing back precautions and compensatory techniques for grooming, LB ADLs, toileting, and shower transfer     Vision         Perception     Praxis      Pertinent Vitals/Pain Pain Assessment Pain Assessment: 0-10 Pain Score: 3  Pain Location: Back Pain Descriptors / Indicators: Discomfort, Grimacing Pain Intervention(s): Monitored during session     Hand Dominance     Extremity/Trunk Assessment Upper Extremity Assessment Upper Extremity Assessment: Overall WFL for tasks assessed   Lower Extremity Assessment Lower Extremity Assessment: Defer to PT evaluation   Cervical / Trunk Assessment Cervical / Trunk Assessment: Back Surgery   Communication Communication Communication: No difficulties   Cognition Arousal/Alertness: Awake/alert Behavior During Therapy: WFL for tasks assessed/performed Overall Cognitive Status: Impaired/Different from baseline Area of Impairment: Problem solving  Problem Solving: Slow processing, Requires verbal cues General Comments: Slightly tangential. Requiring increased time and cues     General Comments  Wife present throughout    Exercises     Shoulder Instructions      Home Living Family/patient expects to be discharged to:: Private residence Living Arrangements: Spouse/significant other;Children;Non-relatives/Friends Available Help at Discharge: Family Type of Home: House Home Access: Stairs to enter Technical brewer of Steps: 6 Entrance Stairs-Rails: Left;Right Home Layout: One level     Bathroom Shower/Tub: Walk-in shower;Tub/shower unit   Bathroom Toilet: Standard      Home Equipment: None          Prior Functioning/Environment Prior Level of Function : Independent/Modified Independent;Driving;Working/employed                        OT Problem List: Decreased activity tolerance;Decreased range of motion;Decreased knowledge of use of DME or AE;Decreased knowledge of precautions;Pain      OT Treatment/Interventions:      OT Goals(Current goals can be found in the care plan section) Acute Rehab OT Goals Patient Stated Goal: Go home OT Goal Formulation: All assessment and education complete, DC therapy  OT Frequency:      Co-evaluation              AM-PAC OT "6 Clicks" Daily Activity     Outcome Measure Help from another person eating meals?: None Help from another person taking care of personal grooming?: None Help from another person toileting, which includes using toliet, bedpan, or urinal?: None Help from another person bathing (including washing, rinsing, drying)?: None Help from another person to put on and taking off regular upper body clothing?: None Help from another person to put on and taking off regular lower body clothing?: None 6 Click Score: 24   End of Session Nurse Communication: Mobility status  Activity Tolerance: Patient tolerated treatment well Patient left: Other (comment) (in hallway with PT)  OT Visit Diagnosis: Muscle weakness (generalized) (M62.81);Pain                Time: 1694-5038 OT Time Calculation (min): 14 min Charges:  OT General Charges $OT Visit: 1 Visit OT Evaluation $OT Eval Low Complexity: 1 Low  Daniel Harris MSOT, OTR/L Acute Rehab Office: [place] 10/27/2021, 8:07 AM

## 2021-10-27 NOTE — Discharge Instructions (Signed)
Wound Care Keep incision covered and dry for two days.  Do not put any creams, lotions, or ointments on incision. Leave steri-strips on back.  They will fall off by themselves. Activity Walk each and every day, increasing distance each day. No lifting greater than 5 lbs.  Avoid excessive neck motion. No driving for 2 weeks; may ride as a passenger locally. Diet Resume your normal diet.  Return to Work Will be discussed at you follow up appointment. Call Your Doctor If Any of These Occur Redness, drainage, or swelling at the wound.  Temperature greater than 101 degrees. Severe pain not relieved by pain medication. Incision starts to come apart. Follow Up Appt Call today for appointment in 1-2 weeks ([phone]) or for problems.  If you have any hardware placed in your spine, you will need an x-ray before your appointment.

## 2021-10-29 LAB — SURGICAL PATHOLOGY

## 2022-09-23 ENCOUNTER — Other Ambulatory Visit: Payer: Self-pay | Admitting: Family Medicine

## 2022-09-23 DIAGNOSIS — M5136 Other intervertebral disc degeneration, lumbar region: Secondary | ICD-10-CM

## 2022-09-23 DIAGNOSIS — M5416 Radiculopathy, lumbar region: Secondary | ICD-10-CM

## 2022-10-10 IMAGING — MR MR CERVICAL SPINE W/O CM
5 of 6 series · 35 of 48 positions shown · non-contrast
Comparison: MRI of the cervical spine April 20, 2012.

CLINICAL DATA: Cervical radiculitis G46.GP ([id number]).

EXAM:
MRI CERVICAL SPINE WITHOUT CONTRAST
TECHNIQUE: Multiplanar, multisequence MR imaging of the cervical spine was
performed. No intravenous contrast was administered.

[Series 6: T1 · sagittal · 3.0mm · 0.66mm/px · 5 of 16 slices shown]
[im 1/16]
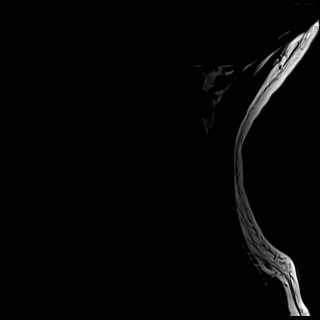
[im 4/16]
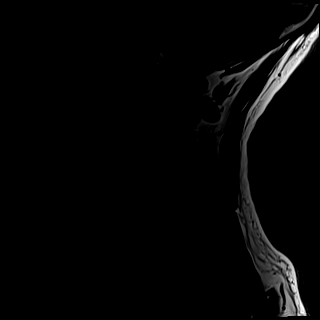
[im 8/16]
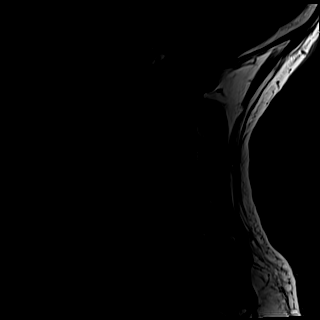
[im 12/16]
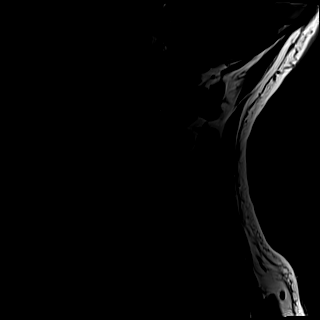
[im 16/16]
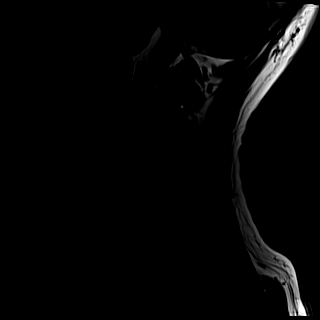

[Series 7: T2 · sagittal · 3.0mm · 0.55mm/px · 5 of 16 slices shown (1 of 3)]
[im 1/16]
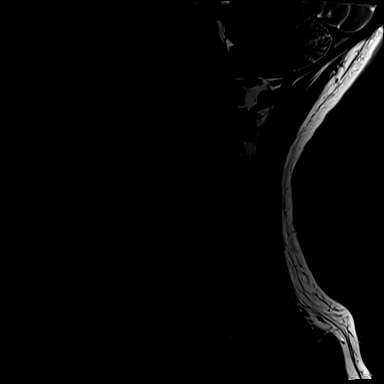
[im 4/16]
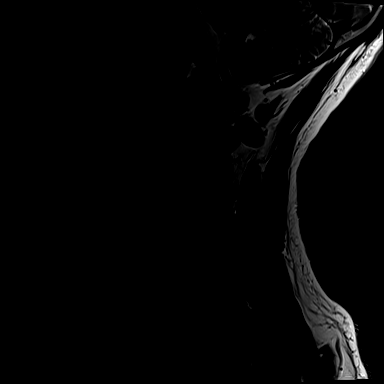
[im 8/16]
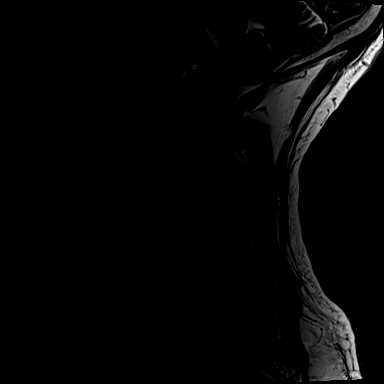
[im 12/16]
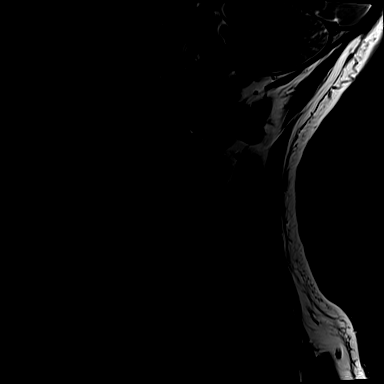
[im 16/16]
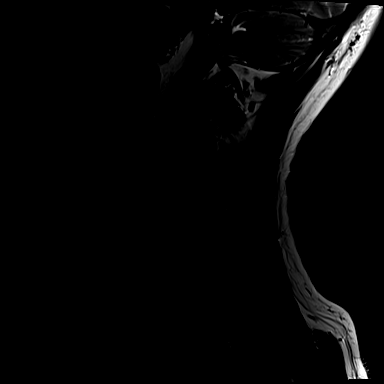

[Series 8: STIR · sagittal · 3.0mm · 0.33mm/px · 3 of 16 slices shown]
[im 1/16]
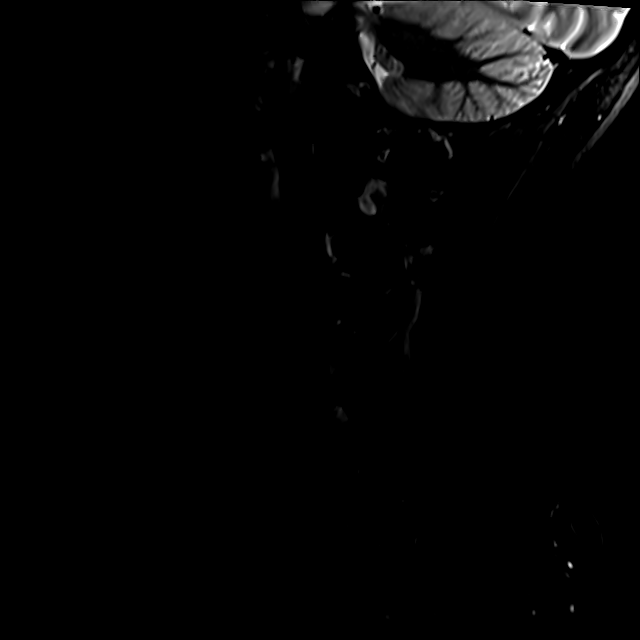
[im 4/16]
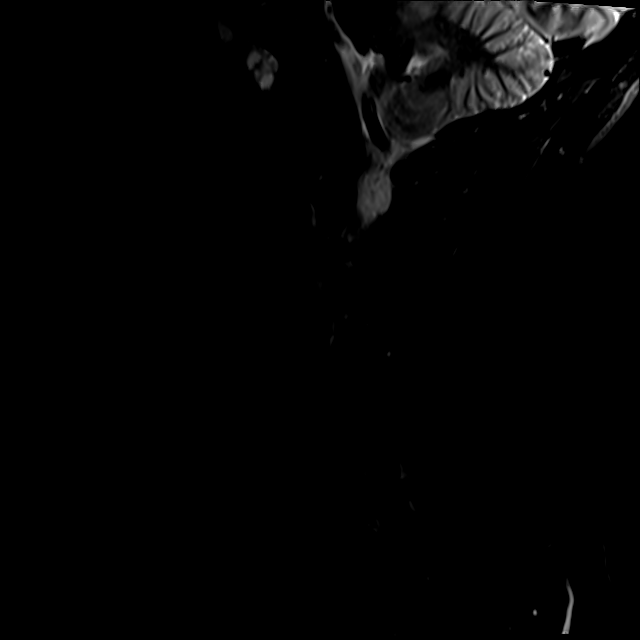
[im 8/16]
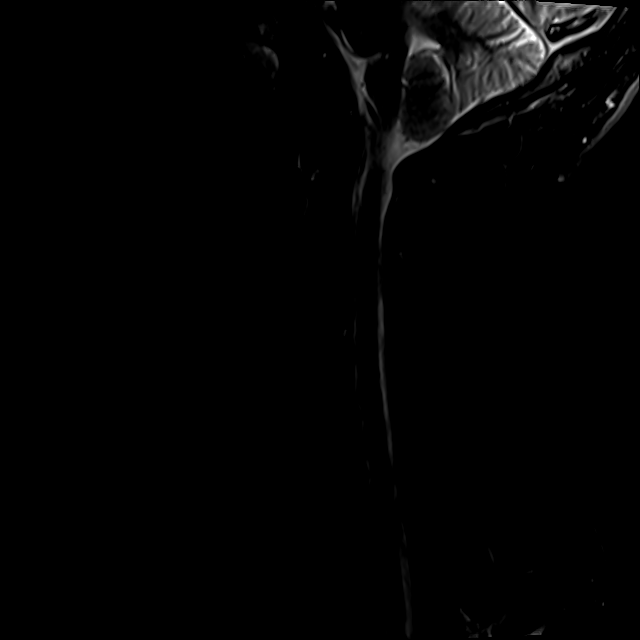

[Series 9: T2 · axial · 3.0mm · 0.50mm/px · z∈[-48,+60]mm · 11 of 36 slices shown (2 of 3)]
[im 1/36]
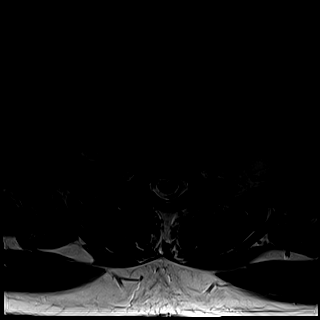
[im 4/36]
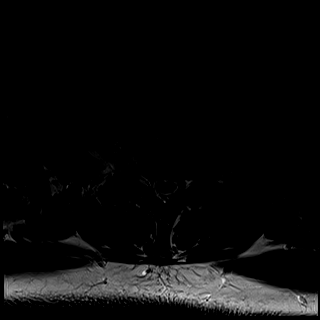
[im 8/36]
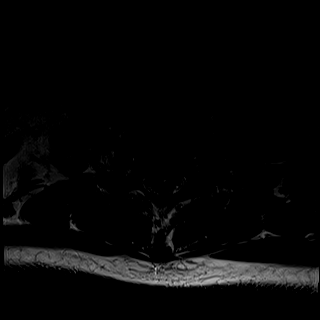
[im 11/36]
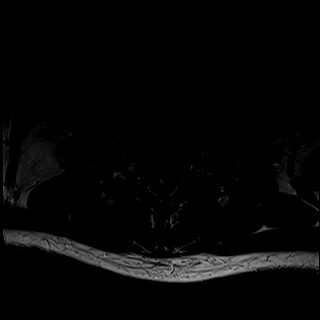
[im 15/36]
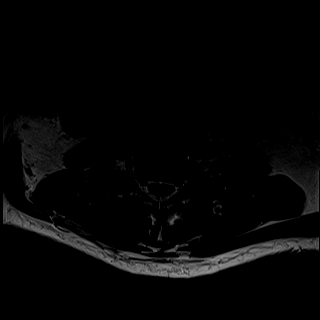
[im 18/36]
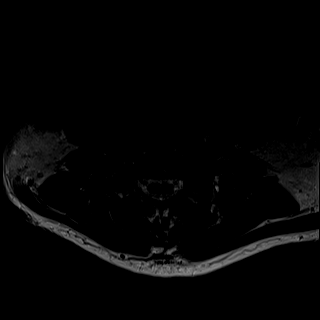
[im 22/36]
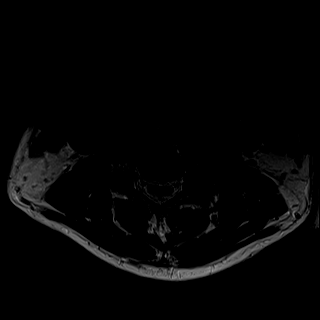
[im 25/36]
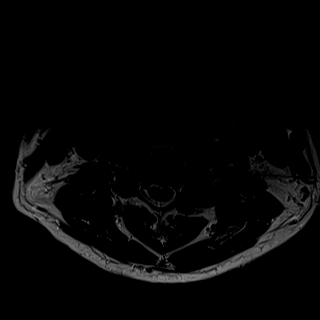
[im 29/36]
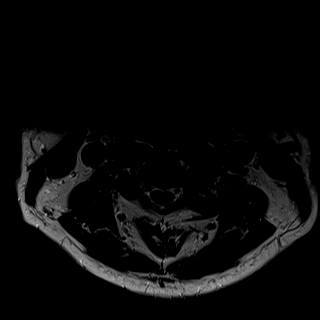
[im 32/36]
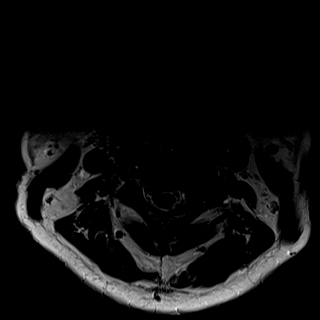
[im 36/36]
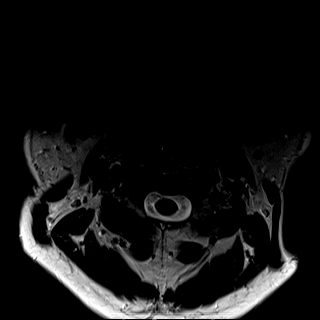

[Series 11: T2 · axial · 3.0mm · 0.50mm/px · z∈[-48,+60]mm · 11 of 36 slices shown (3 of 3)]
[im 1/36]
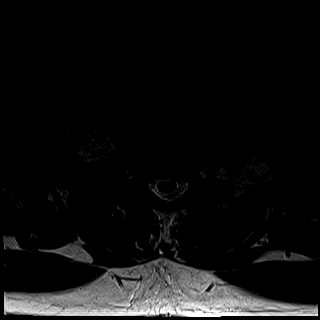
[im 4/36]
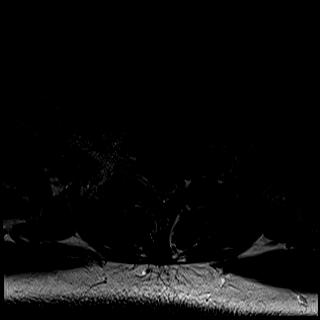
[im 8/36]
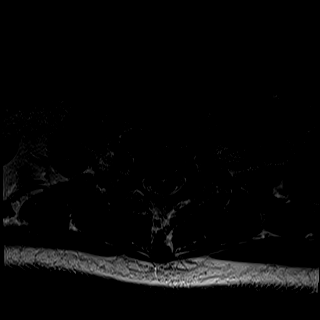
[im 11/36]
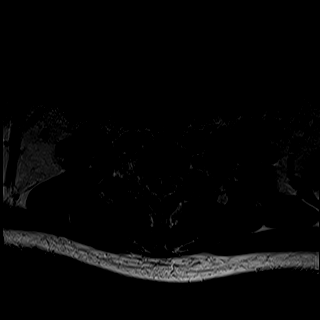
[im 15/36]
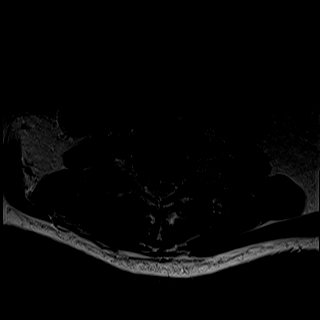
[im 18/36]
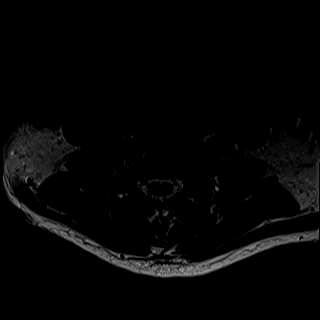
[im 22/36]
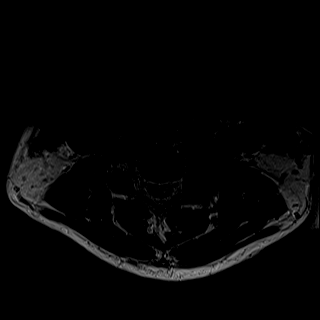
[im 25/36]
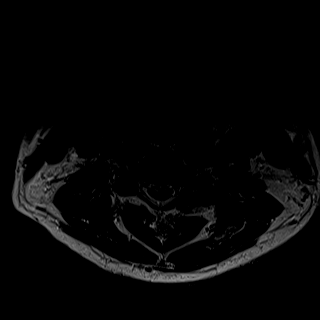
[im 29/36]
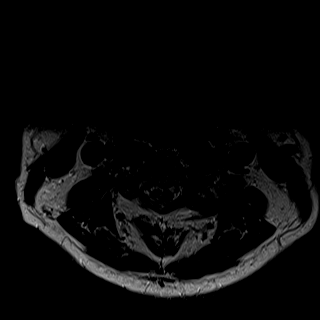
[im 32/36]
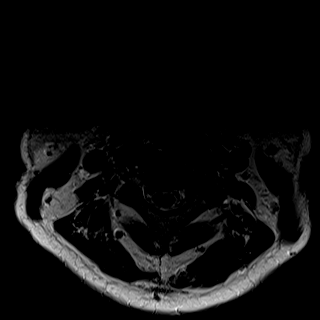
[im 36/36]
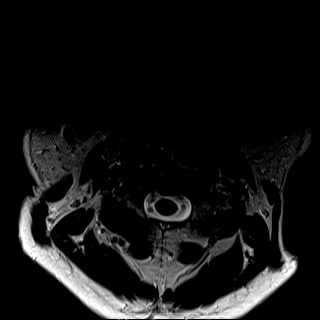

[35 of 48 positions shown; findings below may reference images not displayed]

FINDINGS: Alignment: Straightening of the cervical curvature.

Vertebrae: No fracture, evidence of discitis, or bone lesion. Marrow
edema at the left facet joint at C3-4. Congenitally small spinal
canal.

Cord: Normal signal and morphology.

Posterior Fossa, vertebral arteries, paraspinal tissues: Negative.

Disc levels:

C2-3: Hypertrophic left facet degenerative changes resulting in
severe left neural foraminal narrowing, mildly progressed from prior
MRI. No spinal canal stenosis.

C3-4: Tiny central disc protrusion without significant spinal canal
stenosis. Uncovertebral and facet degenerative changes with
prominent left facet hypertrophy resulting in moderate right and
severe left neural foraminal narrowing, progressed since prior MRI,
particularly on the left.

C4-5: Tiny posterior disc protrusion without significant spinal
canal stenosis. Uncovertebral and facet degenerative changes with
prominent left facet hypertrophy, resulting in mild bilateral neural
foraminal narrowing, progressed since prior MRI.

C5-6: Posterior disc osteophyte complex resulting in mild spinal
canal stenosis. Uncovertebral and facet degenerative changes
resulting in mild right and mild-to-moderate left neural foraminal
narrowing, mildly progressed since prior MRI.

C6-7: Posterior disc osteophyte complex resulting in mild spinal
canal stenosis. Uncovertebral and facet degenerative changes
resulting in mild left neural foraminal narrowing. Findings have
progressed since prior MRI

C7-T1 facet degenerative changes resulting in mild bilateral neural
foraminal narrowing, stable. No significant spinal canal stenosis.
IMPRESSION: 1. Progression of degenerative changes of the cervical spine,
particularly at the level of the facet joints with multilevel
high-grade neural foraminal narrowing, as described above.
2. The degenerative changes superimposed on congenitally small
spinal canal results in mild spinal canal stenosis at C5-6 and C6-7.

## 2022-10-16 ENCOUNTER — Ambulatory Visit
Admission: RE | Admit: 2022-10-16 | Discharge: 2022-10-16 | Disposition: A | Discharge status: Home / Self Care | Payer: BC Managed Care – PPO | Source: Ambulatory Visit | Attending: Family Medicine | Admitting: Family Medicine

## 2022-10-16 DIAGNOSIS — M5136 Other intervertebral disc degeneration, lumbar region: Secondary | ICD-10-CM

## 2022-10-16 DIAGNOSIS — M5416 Radiculopathy, lumbar region: Secondary | ICD-10-CM

## 2023-06-11 ENCOUNTER — Ambulatory Visit: Payer: Self-pay

## 2023-06-11 ENCOUNTER — Other Ambulatory Visit: Payer: Self-pay | Admitting: Occupational Medicine

## 2023-06-11 DIAGNOSIS — M25561 Pain in right knee: Secondary | ICD-10-CM

## 2023-06-16 ENCOUNTER — Other Ambulatory Visit (HOSPITAL_COMMUNITY): Payer: Self-pay | Admitting: Neurosurgery

## 2023-06-16 ENCOUNTER — Ambulatory Visit (HOSPITAL_COMMUNITY)
Admission: RE | Admit: 2023-06-16 | Discharge: 2023-06-16 | Disposition: A | Discharge status: Home / Self Care | Source: Ambulatory Visit | Attending: Surgery | Admitting: Surgery

## 2023-06-16 DIAGNOSIS — M7989 Other specified soft tissue disorders: Secondary | ICD-10-CM | POA: Insufficient documentation

## 2023-06-16 DIAGNOSIS — M79604 Pain in right leg: Secondary | ICD-10-CM | POA: Diagnosis present
